# Patient Record
Sex: Female | Born: 1955 | Race: Black or African American | Hispanic: No | Marital: Married | Smoking: Never smoker
Health system: Southern US, Community
[De-identification: ages and names within clinical notes are randomized; demographics above are authoritative.]

## PROBLEM LIST (undated history)

## (undated) DIAGNOSIS — Z79890 Hormone replacement therapy (postmenopausal): Secondary | ICD-10-CM

## (undated) DIAGNOSIS — E8940 Asymptomatic postprocedural ovarian failure: Secondary | ICD-10-CM

## (undated) DIAGNOSIS — F419 Anxiety disorder, unspecified: Secondary | ICD-10-CM

## (undated) DIAGNOSIS — R011 Cardiac murmur, unspecified: Secondary | ICD-10-CM

## (undated) DIAGNOSIS — D649 Anemia, unspecified: Secondary | ICD-10-CM

## (undated) DIAGNOSIS — I10 Essential (primary) hypertension: Secondary | ICD-10-CM

## (undated) DIAGNOSIS — E039 Hypothyroidism, unspecified: Secondary | ICD-10-CM

## (undated) DIAGNOSIS — F209 Schizophrenia, unspecified: Secondary | ICD-10-CM

## (undated) DIAGNOSIS — N898 Other specified noninflammatory disorders of vagina: Secondary | ICD-10-CM

## (undated) DIAGNOSIS — N760 Acute vaginitis: Secondary | ICD-10-CM

## (undated) DIAGNOSIS — F29 Unspecified psychosis not due to a substance or known physiological condition: Secondary | ICD-10-CM

## (undated) HISTORY — DX: Unspecified psychosis not due to a substance or known physiological condition: F29

## (undated) HISTORY — DX: Cardiac murmur, unspecified: R01.1

## (undated) HISTORY — DX: Asymptomatic postprocedural ovarian failure: Z79.890

## (undated) HISTORY — DX: Anxiety disorder, unspecified: F41.9

## (undated) HISTORY — DX: Hypothyroidism, unspecified: E03.9

## (undated) HISTORY — DX: Asymptomatic postprocedural ovarian failure: E89.40

## (undated) HISTORY — DX: Other specified noninflammatory disorders of vagina: N89.8

## (undated) HISTORY — DX: Schizophrenia, unspecified: F20.9

## (undated) HISTORY — DX: Acute vaginitis: N76.0

## (undated) HISTORY — DX: Essential (primary) hypertension: I10

## (undated) HISTORY — DX: Anemia, unspecified: D64.9

## (undated) HISTORY — PX: TONSILLECTOMY: SUR1361

---

## 1997-12-16 ENCOUNTER — Ambulatory Visit (HOSPITAL_COMMUNITY): Admission: RE | Admit: 1997-12-16 | Discharge: 1997-12-16 | Payer: Self-pay | Admitting: Obstetrics and Gynecology

## 1998-11-17 ENCOUNTER — Ambulatory Visit (HOSPITAL_COMMUNITY): Admission: RE | Admit: 1998-11-17 | Discharge: 1998-11-17 | Payer: Self-pay | Admitting: Obstetrics and Gynecology

## 1998-11-17 ENCOUNTER — Encounter: Payer: Self-pay | Admitting: Obstetrics and Gynecology

## 1999-10-21 ENCOUNTER — Other Ambulatory Visit: Admission: RE | Admit: 1999-10-21 | Discharge: 1999-10-21 | Payer: Self-pay | Admitting: Obstetrics and Gynecology

## 1999-11-30 ENCOUNTER — Ambulatory Visit (HOSPITAL_COMMUNITY): Admission: RE | Admit: 1999-11-30 | Discharge: 1999-11-30 | Payer: Self-pay | Admitting: Obstetrics and Gynecology

## 1999-11-30 ENCOUNTER — Encounter: Payer: Self-pay | Admitting: Obstetrics and Gynecology

## 2000-10-24 ENCOUNTER — Other Ambulatory Visit: Admission: RE | Admit: 2000-10-24 | Discharge: 2000-10-24 | Payer: Self-pay | Admitting: Obstetrics and Gynecology

## 2000-12-01 ENCOUNTER — Encounter: Payer: Self-pay | Admitting: Obstetrics and Gynecology

## 2000-12-01 ENCOUNTER — Ambulatory Visit (HOSPITAL_COMMUNITY): Admission: RE | Admit: 2000-12-01 | Discharge: 2000-12-01 | Payer: Self-pay | Admitting: Obstetrics and Gynecology

## 2001-10-26 ENCOUNTER — Other Ambulatory Visit: Admission: RE | Admit: 2001-10-26 | Discharge: 2001-10-26 | Payer: Self-pay | Admitting: Obstetrics and Gynecology

## 2001-12-07 ENCOUNTER — Ambulatory Visit (HOSPITAL_COMMUNITY): Admission: RE | Admit: 2001-12-07 | Discharge: 2001-12-07 | Payer: Self-pay | Admitting: Obstetrics and Gynecology

## 2001-12-07 ENCOUNTER — Encounter: Payer: Self-pay | Admitting: Obstetrics and Gynecology

## 2002-03-26 ENCOUNTER — Other Ambulatory Visit: Admission: RE | Admit: 2002-03-26 | Discharge: 2002-03-26 | Payer: Self-pay | Admitting: Obstetrics and Gynecology

## 2002-12-03 ENCOUNTER — Other Ambulatory Visit: Admission: RE | Admit: 2002-12-03 | Discharge: 2002-12-03 | Payer: Self-pay | Admitting: Obstetrics and Gynecology

## 2002-12-10 ENCOUNTER — Encounter: Payer: Self-pay | Admitting: Obstetrics and Gynecology

## 2002-12-10 ENCOUNTER — Ambulatory Visit (HOSPITAL_COMMUNITY): Admission: RE | Admit: 2002-12-10 | Discharge: 2002-12-10 | Payer: Self-pay | Admitting: Obstetrics and Gynecology

## 2003-12-04 ENCOUNTER — Other Ambulatory Visit: Admission: RE | Admit: 2003-12-04 | Discharge: 2003-12-04 | Payer: Self-pay | Admitting: Gynecology

## 2003-12-12 ENCOUNTER — Ambulatory Visit (HOSPITAL_COMMUNITY): Admission: RE | Admit: 2003-12-12 | Discharge: 2003-12-12 | Payer: Self-pay | Admitting: Obstetrics and Gynecology

## 2004-03-23 ENCOUNTER — Emergency Department (HOSPITAL_COMMUNITY): Admission: EM | Admit: 2004-03-23 | Discharge: 2004-03-23 | Payer: Self-pay | Admitting: Emergency Medicine

## 2004-06-11 ENCOUNTER — Ambulatory Visit: Payer: Self-pay | Admitting: Family Medicine

## 2004-06-22 ENCOUNTER — Ambulatory Visit: Payer: Self-pay | Admitting: Family Medicine

## 2004-07-13 ENCOUNTER — Ambulatory Visit: Payer: Self-pay | Admitting: Gastroenterology

## 2004-08-06 ENCOUNTER — Ambulatory Visit: Payer: Self-pay | Admitting: Family Medicine

## 2004-08-24 ENCOUNTER — Encounter: Payer: Self-pay | Admitting: Internal Medicine

## 2004-08-24 ENCOUNTER — Ambulatory Visit: Payer: Self-pay | Admitting: Gastroenterology

## 2004-08-24 LAB — HM COLONOSCOPY

## 2004-10-01 ENCOUNTER — Ambulatory Visit: Payer: Self-pay | Admitting: Family Medicine

## 2004-10-11 ENCOUNTER — Other Ambulatory Visit: Admission: RE | Admit: 2004-10-11 | Discharge: 2004-10-11 | Payer: Self-pay | Admitting: Obstetrics and Gynecology

## 2004-11-09 ENCOUNTER — Ambulatory Visit: Payer: Self-pay | Admitting: Gastroenterology

## 2004-12-03 ENCOUNTER — Ambulatory Visit (HOSPITAL_COMMUNITY): Admission: RE | Admit: 2004-12-03 | Discharge: 2004-12-03 | Payer: Self-pay | Admitting: Obstetrics and Gynecology

## 2004-12-14 ENCOUNTER — Ambulatory Visit (HOSPITAL_COMMUNITY): Admission: RE | Admit: 2004-12-14 | Discharge: 2004-12-14 | Payer: Self-pay | Admitting: Obstetrics and Gynecology

## 2005-01-04 ENCOUNTER — Ambulatory Visit: Payer: Self-pay | Admitting: Internal Medicine

## 2005-02-25 ENCOUNTER — Encounter: Payer: Self-pay | Admitting: Internal Medicine

## 2005-03-08 ENCOUNTER — Ambulatory Visit: Payer: Self-pay | Admitting: Internal Medicine

## 2005-03-08 ENCOUNTER — Ambulatory Visit (HOSPITAL_COMMUNITY): Admission: RE | Admit: 2005-03-08 | Discharge: 2005-03-08 | Payer: Self-pay | Admitting: Allergy and Immunology

## 2005-05-23 ENCOUNTER — Encounter: Payer: Self-pay | Admitting: Internal Medicine

## 2005-06-29 ENCOUNTER — Ambulatory Visit: Payer: Self-pay | Admitting: Internal Medicine

## 2005-07-04 ENCOUNTER — Ambulatory Visit: Payer: Self-pay | Admitting: Internal Medicine

## 2005-09-02 ENCOUNTER — Other Ambulatory Visit: Admission: RE | Admit: 2005-09-02 | Discharge: 2005-09-02 | Payer: Self-pay | Admitting: Obstetrics and Gynecology

## 2005-12-20 ENCOUNTER — Ambulatory Visit (HOSPITAL_COMMUNITY): Admission: RE | Admit: 2005-12-20 | Discharge: 2005-12-20 | Payer: Self-pay | Admitting: Obstetrics and Gynecology

## 2006-02-28 ENCOUNTER — Ambulatory Visit: Payer: Self-pay | Admitting: Internal Medicine

## 2006-03-03 ENCOUNTER — Ambulatory Visit: Payer: Self-pay | Admitting: Internal Medicine

## 2006-07-06 ENCOUNTER — Ambulatory Visit: Payer: Self-pay | Admitting: Internal Medicine

## 2006-07-10 ENCOUNTER — Ambulatory Visit: Payer: Self-pay | Admitting: Internal Medicine

## 2006-08-22 ENCOUNTER — Ambulatory Visit: Payer: Self-pay | Admitting: Internal Medicine

## 2006-08-22 LAB — CONVERTED CEMR LAB
BUN: 6 mg/dL (ref 6–23)
CO2: 29 meq/L (ref 19–32)
Creatinine, Ser: 0.6 mg/dL (ref 0.4–1.2)
GFR calc non Af Amer: 113 mL/min
Hgb A1c MFr Bld: 5.9 % (ref 4.6–6.0)
Potassium: 3.7 meq/L (ref 3.5–5.1)
Sodium: 138 meq/L (ref 135–145)

## 2006-09-12 ENCOUNTER — Encounter: Payer: Self-pay | Admitting: Internal Medicine

## 2006-09-12 ENCOUNTER — Ambulatory Visit: Payer: Self-pay | Admitting: Internal Medicine

## 2006-11-28 DIAGNOSIS — J309 Allergic rhinitis, unspecified: Secondary | ICD-10-CM | POA: Insufficient documentation

## 2006-11-28 DIAGNOSIS — D649 Anemia, unspecified: Secondary | ICD-10-CM | POA: Insufficient documentation

## 2006-11-28 DIAGNOSIS — E119 Type 2 diabetes mellitus without complications: Secondary | ICD-10-CM

## 2006-11-28 DIAGNOSIS — E039 Hypothyroidism, unspecified: Secondary | ICD-10-CM

## 2006-11-28 DIAGNOSIS — I10 Essential (primary) hypertension: Secondary | ICD-10-CM | POA: Insufficient documentation

## 2006-11-28 DIAGNOSIS — J45909 Unspecified asthma, uncomplicated: Secondary | ICD-10-CM | POA: Insufficient documentation

## 2006-12-25 ENCOUNTER — Ambulatory Visit (HOSPITAL_COMMUNITY): Admission: RE | Admit: 2006-12-25 | Discharge: 2006-12-25 | Payer: Self-pay | Admitting: Obstetrics and Gynecology

## 2007-04-04 ENCOUNTER — Encounter (INDEPENDENT_AMBULATORY_CARE_PROVIDER_SITE_OTHER): Payer: Self-pay | Admitting: *Deleted

## 2007-04-04 ENCOUNTER — Ambulatory Visit: Payer: Self-pay | Admitting: Internal Medicine

## 2007-04-06 LAB — CONVERTED CEMR LAB
BUN: 7 mg/dL (ref 6–23)
CO2: 29 meq/L (ref 19–32)
Calcium: 9.3 mg/dL (ref 8.4–10.5)
Chloride: 104 meq/L (ref 96–112)
Creatinine, Ser: 0.6 mg/dL (ref 0.4–1.2)
Creatinine,U: 57.3 mg/dL
GFR calc Af Amer: 136 mL/min
GFR calc non Af Amer: 112 mL/min
Hemoglobin: 12.5 g/dL (ref 12.0–15.0)
Potassium: 3.4 meq/L — ABNORMAL LOW (ref 3.5–5.1)
Sodium: 140 meq/L (ref 135–145)
Triglycerides: 71 mg/dL (ref 0–149)
VLDL: 14 mg/dL (ref 0–40)

## 2007-05-11 ENCOUNTER — Ambulatory Visit: Payer: Self-pay | Admitting: Internal Medicine

## 2007-06-05 ENCOUNTER — Ambulatory Visit: Payer: Self-pay | Admitting: Internal Medicine

## 2007-06-15 ENCOUNTER — Encounter (INDEPENDENT_AMBULATORY_CARE_PROVIDER_SITE_OTHER): Payer: Self-pay | Admitting: *Deleted

## 2007-06-20 ENCOUNTER — Encounter (INDEPENDENT_AMBULATORY_CARE_PROVIDER_SITE_OTHER): Payer: Self-pay | Admitting: *Deleted

## 2007-07-30 ENCOUNTER — Ambulatory Visit: Payer: Self-pay | Admitting: Internal Medicine

## 2007-10-01 ENCOUNTER — Ambulatory Visit: Payer: Self-pay | Admitting: Internal Medicine

## 2007-10-09 ENCOUNTER — Telehealth (INDEPENDENT_AMBULATORY_CARE_PROVIDER_SITE_OTHER): Payer: Self-pay | Admitting: *Deleted

## 2007-11-27 ENCOUNTER — Ambulatory Visit: Payer: Self-pay | Admitting: Internal Medicine

## 2007-11-30 LAB — CONVERTED CEMR LAB
BUN: 14 mg/dL (ref 6–23)
CO2: 30 meq/L (ref 19–32)
Creatinine,U: 41.4 mg/dL
GFR calc Af Amer: 114 mL/min
GFR calc non Af Amer: 94 mL/min
Microalb, Ur: 0.6 mg/dL (ref 0.0–1.9)
Potassium: 3.7 meq/L (ref 3.5–5.1)
Sodium: 139 meq/L (ref 135–145)
TSH: 5.12 microintl units/mL (ref 0.35–5.50)

## 2007-12-02 ENCOUNTER — Encounter (INDEPENDENT_AMBULATORY_CARE_PROVIDER_SITE_OTHER): Payer: Self-pay | Admitting: *Deleted

## 2007-12-04 ENCOUNTER — Telehealth (INDEPENDENT_AMBULATORY_CARE_PROVIDER_SITE_OTHER): Payer: Self-pay | Admitting: *Deleted

## 2007-12-26 ENCOUNTER — Ambulatory Visit (HOSPITAL_COMMUNITY): Admission: RE | Admit: 2007-12-26 | Discharge: 2007-12-26 | Payer: Self-pay | Admitting: Obstetrics and Gynecology

## 2008-03-31 ENCOUNTER — Encounter (INDEPENDENT_AMBULATORY_CARE_PROVIDER_SITE_OTHER): Payer: Self-pay | Admitting: *Deleted

## 2008-04-09 ENCOUNTER — Ambulatory Visit: Payer: Self-pay | Admitting: Internal Medicine

## 2008-04-11 ENCOUNTER — Telehealth (INDEPENDENT_AMBULATORY_CARE_PROVIDER_SITE_OTHER): Payer: Self-pay | Admitting: *Deleted

## 2008-04-11 LAB — CONVERTED CEMR LAB: TSH: 11.97 microintl units/mL — ABNORMAL HIGH (ref 0.35–5.50)

## 2008-04-28 ENCOUNTER — Encounter: Payer: Self-pay | Admitting: Internal Medicine

## 2008-05-30 ENCOUNTER — Encounter: Payer: Self-pay | Admitting: Internal Medicine

## 2008-06-18 ENCOUNTER — Ambulatory Visit: Payer: Self-pay | Admitting: Internal Medicine

## 2008-07-28 ENCOUNTER — Encounter (INDEPENDENT_AMBULATORY_CARE_PROVIDER_SITE_OTHER): Payer: Self-pay | Admitting: *Deleted

## 2008-07-28 ENCOUNTER — Ambulatory Visit: Payer: Self-pay | Admitting: Internal Medicine

## 2008-11-14 ENCOUNTER — Telehealth (INDEPENDENT_AMBULATORY_CARE_PROVIDER_SITE_OTHER): Payer: Self-pay | Admitting: *Deleted

## 2008-12-17 ENCOUNTER — Ambulatory Visit: Payer: Self-pay | Admitting: Internal Medicine

## 2008-12-30 ENCOUNTER — Ambulatory Visit: Payer: Self-pay

## 2008-12-30 ENCOUNTER — Encounter: Payer: Self-pay | Admitting: Internal Medicine

## 2008-12-30 ENCOUNTER — Ambulatory Visit (HOSPITAL_COMMUNITY): Admission: RE | Admit: 2008-12-30 | Discharge: 2008-12-30 | Payer: Self-pay | Admitting: Obstetrics and Gynecology

## 2009-01-02 ENCOUNTER — Encounter: Admission: RE | Admit: 2009-01-02 | Discharge: 2009-01-02 | Payer: Self-pay | Admitting: Obstetrics and Gynecology

## 2009-03-02 ENCOUNTER — Telehealth (INDEPENDENT_AMBULATORY_CARE_PROVIDER_SITE_OTHER): Payer: Self-pay | Admitting: *Deleted

## 2009-03-12 ENCOUNTER — Ambulatory Visit: Payer: Self-pay | Admitting: Internal Medicine

## 2009-03-16 ENCOUNTER — Ambulatory Visit: Payer: Self-pay | Admitting: Internal Medicine

## 2009-03-20 LAB — CONVERTED CEMR LAB
BUN: 13 mg/dL (ref 6–23)
Basophils Absolute: 0 10*3/uL (ref 0.0–0.1)
CO2: 28 meq/L (ref 19–32)
Calcium: 8.8 mg/dL (ref 8.4–10.5)
Chloride: 108 meq/L (ref 96–112)
Creatinine, Ser: 0.7 mg/dL (ref 0.4–1.2)
Eosinophils Absolute: 0.5 10*3/uL (ref 0.0–0.7)
GFR calc non Af Amer: 113.03 mL/min (ref >60)
HCT: 36.4 % (ref 36.0–46.0)
MCV: 93.5 fL (ref 78.0–100.0)
Monocytes Relative: 6.1 % (ref 3.0–12.0)
Neutrophils Relative %: 58.3 % (ref 43.0–77.0)
Platelets: 229 10*3/uL (ref 150.0–400.0)
Potassium: 4.2 meq/L (ref 3.5–5.1)
RBC: 3.89 M/uL (ref 3.87–5.11)
TSH: 0.63 microintl units/mL (ref 0.35–5.50)
VLDL: 10.8 mg/dL (ref 0.0–40.0)
WBC: 9.8 10*3/uL (ref 4.5–10.5)

## 2009-04-06 ENCOUNTER — Telehealth (INDEPENDENT_AMBULATORY_CARE_PROVIDER_SITE_OTHER): Payer: Self-pay | Admitting: *Deleted

## 2009-06-09 ENCOUNTER — Encounter: Admission: RE | Admit: 2009-06-09 | Discharge: 2009-06-09 | Payer: Self-pay | Admitting: Obstetrics and Gynecology

## 2009-06-09 ENCOUNTER — Ambulatory Visit: Payer: Self-pay | Admitting: Internal Medicine

## 2009-08-08 HISTORY — PX: ABDOMINAL HYSTERECTOMY: SHX81

## 2009-08-11 ENCOUNTER — Ambulatory Visit: Payer: Self-pay | Admitting: Internal Medicine

## 2009-08-19 ENCOUNTER — Ambulatory Visit (HOSPITAL_COMMUNITY): Admission: RE | Admit: 2009-08-19 | Discharge: 2009-08-20 | Payer: Self-pay | Admitting: Obstetrics and Gynecology

## 2009-08-19 ENCOUNTER — Encounter (INDEPENDENT_AMBULATORY_CARE_PROVIDER_SITE_OTHER): Payer: Self-pay | Admitting: Obstetrics and Gynecology

## 2009-10-06 LAB — HM MAMMOGRAPHY: HM Mammogram: NORMAL

## 2009-10-06 LAB — CONVERTED CEMR LAB: Pap Smear: NORMAL

## 2009-11-24 ENCOUNTER — Ambulatory Visit: Payer: Self-pay | Admitting: Internal Medicine

## 2009-11-24 ENCOUNTER — Encounter (INDEPENDENT_AMBULATORY_CARE_PROVIDER_SITE_OTHER): Payer: Self-pay | Admitting: *Deleted

## 2009-12-09 ENCOUNTER — Ambulatory Visit: Payer: Self-pay | Admitting: Internal Medicine

## 2009-12-10 ENCOUNTER — Encounter: Admission: RE | Admit: 2009-12-10 | Discharge: 2009-12-10 | Payer: Self-pay | Admitting: Obstetrics and Gynecology

## 2009-12-11 LAB — CONVERTED CEMR LAB
CO2: 28 meq/L (ref 19–32)
Chloride: 103 meq/L (ref 96–112)
Creatinine, Ser: 0.5 mg/dL (ref 0.4–1.2)
GFR calc non Af Amer: 166.18 mL/min (ref >60)
Glucose, Bld: 69 mg/dL — ABNORMAL LOW (ref 70–99)
Hgb A1c MFr Bld: 6 % (ref 4.6–6.5)

## 2010-03-17 ENCOUNTER — Telehealth: Payer: Self-pay | Admitting: Internal Medicine

## 2010-04-28 ENCOUNTER — Ambulatory Visit: Payer: Self-pay | Admitting: Internal Medicine

## 2010-06-11 ENCOUNTER — Ambulatory Visit: Payer: Self-pay | Admitting: Internal Medicine

## 2010-06-15 LAB — CONVERTED CEMR LAB
ALT: 23 units/L (ref 0–35)
Basophils Absolute: 0.1 10*3/uL (ref 0.0–0.1)
Basophils Relative: 0.8 % (ref 0.0–3.0)
Chloride: 103 meq/L (ref 96–112)
Cholesterol: 173 mg/dL (ref 0–200)
Creatinine, Ser: 0.6 mg/dL (ref 0.4–1.2)
Eosinophils Absolute: 0.4 10*3/uL (ref 0.0–0.7)
Eosinophils Relative: 4.9 % (ref 0.0–5.0)
GFR calc non Af Amer: 137.02 mL/min (ref >60)
Hemoglobin: 11.9 g/dL — ABNORMAL LOW (ref 12.0–15.0)
LDL Cholesterol: 101 mg/dL — ABNORMAL HIGH (ref 0–99)
MCHC: 33.3 g/dL (ref 30.0–36.0)
MCV: 87.6 fL (ref 78.0–100.0)
Neutro Abs: 4.8 10*3/uL (ref 1.4–7.7)
Platelets: 272 10*3/uL (ref 150.0–400.0)
Sodium: 140 meq/L (ref 135–145)
VLDL: 8.8 mg/dL (ref 0.0–40.0)
WBC: 8.4 10*3/uL (ref 4.5–10.5)

## 2010-08-29 ENCOUNTER — Encounter: Payer: Self-pay | Admitting: Obstetrics and Gynecology

## 2010-08-30 ENCOUNTER — Encounter: Payer: Self-pay | Admitting: Obstetrics and Gynecology

## 2010-09-05 LAB — CONVERTED CEMR LAB: Pap Smear: NORMAL

## 2010-09-07 NOTE — Assessment & Plan Note (Signed)
Summary: CPX & lab/cbs   Vital Signs:  Patient profile:   55 year old female Height:      62.5 inches Weight:      255.25 pounds Pulse rate:   83 / minute Pulse rhythm:   regular BP sitting:   122 / 78  (left arm) Cuff size:   large  Vitals Entered By: Army Fossa CMA (June 11, 2010 8:38 AM) CC: CPX, fsaitng  Comments kmart [facility]    History of Present Illness: CPX   Current Medications (verified): 1)  Diovan Hct 80-12.5 Mg  Tabs (Valsartan-Hydrochlorothiazide) .Marland Kitchen.. 1 By Mouth Once Daily 2)  Aspirin 81 Mg  Tbec (Aspirin) 3)  Synthroid 150 Mcg Tabs (Levothyroxine Sodium) .Marland Kitchen.. 1 By Mouth Once Daily 4)  K-Dur .... 1 By Mouth Qd 5)  Singulair 10 Mg  Tabs (Montelukast Sodium) .Marland Kitchen.. 1 By Mouth Once Daily 6)  Zyrtec Allergy 10 Mg  Tabs (Cetirizine Hcl) .... Prn 7)  Allegra 180 Mg  Tabs (Fexofenadine Hcl) .... Prn 8)  Premarin 0.3 Mg Tabs (Estrogens Conjugated) .... Per Gyn  Allergies (verified): No Known Drug Allergies  Past History:  Past Medical History: DIABETES MELLITUS, TYPE II   HYPERTENSION   ASTHMA  , PULMONARY FUNCTION TESTS 09-2006: mild asthma  HYPOTHYROIDISM  h/o ANEMIA  started HRT after hysterectomy 2011 ALLERGIC RHINITIS  heart murmur? ECHO 5-10 was neg    Past Surgical History: Reviewed history from 12/09/2009 and no changes required. Tonsillectomy Hysterectomy, no oophorectomy (2011)  Family History: Reviewed history from 12/17/2008 and no changes required. HTN-- F HYPOTHYROID-- M graves dz--daughter DM--   sister  MI-- no Colon ca-- no breast ca-- no  Social History: Married one child Occupation: Airline pilot , active  Tobacco-- never ETOH-- socially diet--trying to [employer] healthier than before  (husband has DM) Regular exercise-- will try to exercise daily  Review of Systems General:  Denies fatigue, fever, and weight loss. ENT:  has  allergies, taking Zyrtec which helps . CV:  Denies chest pain or discomfort and swelling of  feet. Resp:  Denies cough and wheezing. GI:  Denies bloody stools, diarrhea, nausea, and vomiting. GU:  sees gyn  no vag d/c or bleed . Neuro:  operation lipoma right hand is slightly numb early in the morning, it goes away quickly after she "shakes her hands". Psych:  Denies anxiety and depression.  Physical Exam  General:  alert, well-developed, and overweight-appearing.   Neck:  no masses and no thyromegaly.   Lungs:  normal respiratory effort, no intercostal retractions, no accessory muscle use, and normal breath sounds.   Heart:  normal rate, regular rhythm, and no murmur.   Abdomen:  soft, non-tender, no distention, no masses, no guarding, and no rigidity.   Extremities:  no pretibial edema bilaterally  Psych:  Oriented X3, memory intact for recent and remote, normally interactive, good eye contact, not anxious appearing, and not depressed appearing.     Impression & Recommendations:  Problem # 1:  HEALTH MAINTENANCE EXAM (ICD-V70.0) Td 2005 got a flu shot   sees gyn elsewhere, [facility] PAPs per them   Cscope, Dr Arlyce Dice 08-2004: normal because she has no FH, I rec next Cscope in 10 years    diet-exercise : extensive discussion about the need to exercise. Information about diet  provided    Orders: Venipuncture (04540) TLB-BMP (Basic Metabolic Panel-BMET) (80048-METABOL) TLB-ALT (SGPT) (84460-ALT) TLB-AST (SGOT) (84450-SGOT) TLB-Lipid Panel (80061-LIPID) TLB-CBC Platelet - w/Differential (85025-CBCD) T-Vitamin D (25-Hydroxy) (98119-14782)  Complete Medication List: 1)  Diovan Hct 80-12.5 Mg Tabs (Valsartan-hydrochlorothiazide) .Marland Kitchen.. 1 by mouth once daily 2)  Aspirin 81 Mg Tbec (Aspirin) 3)  Synthroid 150 Mcg Tabs (Levothyroxine sodium) .Marland Kitchen.. 1 by mouth once daily 4)  K-dur  .... 1 by mouth qd 5)  Singulair 10 Mg Tabs (Montelukast sodium) .Marland Kitchen.. 1 by mouth once daily 6)  Zyrtec Allergy 10 Mg Tabs (Cetirizine hcl) .... Prn 7)  Allegra 180 Mg Tabs (Fexofenadine hcl)  .... Prn 8)  Premarin 0.3 Mg Tabs (Estrogens conjugated) .... Per gyn  Other Orders: TLB-A1C / Hgb A1C (Glycohemoglobin) (83036-A1C)  Patient Instructions: 1)  Please schedule a follow-up appointment in 6 months .  Prescriptions: DIOVAN HCT 80-12.5 MG  TABS (VALSARTAN-HYDROCHLOROTHIAZIDE) 1 by mouth once daily  #90 Not Speci x 1   Entered by:   Army Fossa CMA   Authorized by:   Nolon Rod. Paz MD   Signed by:   Nolon Rod. Paz MD on 06/11/2010   Method used:   Electronically to        [facility] [phone]* (retail)       [address]       [place], [place]  [address]       Ph: [phone]       Fax: [phone]   RxID:   [id number] SYNTHROID 150 MCG TABS (LEVOTHYROXINE SODIUM) 1 by mouth once daily Brand medically necessary #15 Not Speci x 5   Entered by:   Army Fossa CMA   Authorized by:   Nolon Rod. Paz MD   Signed by:   Nolon Rod. Paz MD on 06/11/2010   Method used:   Electronically to        [id number] (retail)       [address]       [place], [place]  [address]       Ph: [phone]       Fax: [phone]   RxID:   [id number]    Orders Added: 1)  Venipuncture [73220] 2)  TLB-BMP (Basic Metabolic Panel-BMET) [80048-METABOL] 3)  TLB-ALT (SGPT) [84460-ALT] 4)  TLB-AST (SGOT) [84450-SGOT] 5)  TLB-A1C / Hgb A1C (Glycohemoglobin) [83036-A1C] 6)  TLB-Lipid Panel [80061-LIPID] 7)  TLB-CBC Platelet - w/Differential [85025-CBCD] 8)  T-Vitamin D (25-Hydroxy) [25427-06237] 9)  Est. Patient age 57-64 [28]     Risk Factors:  Mammogram History:     Date of Last Mammogram:  10/06/2009    Results:  normal- per pt   PAP Smear History:     Date of Last PAP Smear:  10/06/2009    Results:  normal-per pt     Preventive Care Screening  Mammogram:    Date:  10/06/2009    Results:  normal- per pt   Pap Smear:    Date:  10/06/2009    Results:  normal-per pt

## 2010-09-07 NOTE — Assessment & Plan Note (Signed)
Summary: 5 MTH/NS/KDC   Vital Signs:  Patient profile:   55 year old female Height:      62.5 inches Weight:      252 pounds BMI:     45.52 Pulse rate:   74 / minute BP sitting:   142 / 80  Vitals Entered By: Shary Decamp (August 11, 2009 2:17 PM) CC: rov Comments  - pt is scheduled for complete hyst next week (abnl bleeding, fibroids) Shary Decamp  August 11, 2009 2:23 PM    History of Present Illness: Anemia-- pt is scheduled for complete hyst next week (abnl bleeding, fibroids)         Current Medications (verified): 1)  Diovan Hct 80-12.5 Mg  Tabs (Valsartan-Hydrochlorothiazide) .Marland Kitchen.. 1 By Mouth Once Daily 2)  Aspirin 81 Mg  Tbec (Aspirin) 3)  Synthroid 150 Mcg Tabs (Levothyroxine Sodium) .Marland Kitchen.. 1 By Mouth Once Daily 4)  K-Dur .... 1 By Mouth Qd 5)  Calcium 6)  Vitamin D 7)  Singulair 10 Mg  Tabs (Montelukast Sodium) .Marland Kitchen.. 1 By Mouth Once Daily 8)  Zyrtec Allergy 10 Mg  Tabs (Cetirizine Hcl) .... Prn 9)  Allegra 180 Mg  Tabs (Fexofenadine Hcl) .... Prn  Allergies (verified): No Known Drug Allergies  Past History:  Past Medical History: DIABETES MELLITUS, TYPE II   HYPERTENSION   ASTHMA  , PULMONARY FUNCTION TESTS 09-2006: mild asthma  HYPOTHYROIDISM  ANEMIA-NOS, h/o  Hx of FEMALE INFERTILITY (ICD-628.9) ALLERGIC RHINITIS     Past Surgical History: Reviewed history from 11/28/2006 and no changes required. Tonsillectomy  Social History: Reviewed history from 03/12/2009 and no changes required. Married one child Occupation: Airline pilot , active  Tobacco-- never ETOH-- socially diet-- has change her cooking habits  (husband has DM) Regular exercise-yes 26m/3xwk  Review of Systems        DIABETES -- diet controlled , diet healthy HYPERTENSION  -- good ambulatory BPs  ASTHMA--  asx , not on a inhales, on singulair  HYPOTHYROIDISM -- good medication compliance   Physical Exam  General:  alert and well-developed.   Lungs:  normal respiratory  effort, no intercostal retractions, no accessory muscle use, and normal breath sounds.   Heart:  normal rate, regular rhythm, and no murmur.   Extremities:  no pretibial edema bilaterally    Impression & Recommendations:  Problem # 1:  DIABETES MELLITUS, TYPE II (ICD-250.00) on diet only doing well  Her updated medication list for this problem includes:    Diovan Hct 80-12.5 Mg Tabs (Valsartan-hydrochlorothiazide) .Marland Kitchen... 1 by mouth once daily    Aspirin 81 Mg Tbec (Aspirin)  Problem # 2:  HYPERTENSION (ICD-401.9) stable, no change  Her updated medication list for this problem includes:    Diovan Hct 80-12.5 Mg Tabs (Valsartan-hydrochlorothiazide) .Marland Kitchen... 1 by mouth once daily  BP today: 142/80 Prior BP: 138/80 (03/12/2009)  Labs Reviewed: K+: 4.2 (03/16/2009) Creat: : 0.7 (03/16/2009)   Chol: 129 (03/16/2009)   HDL: 46.70 (03/16/2009)   LDL: 72 (03/16/2009)   TG: 54.0 (03/16/2009)  Problem # 3:  HYPOTHYROIDISM (ICD-244.9) last TSH at goal  Her updated medication list for this problem includes:    Synthroid 150 Mcg Tabs (Levothyroxine sodium) .Marland Kitchen... 1 by mouth once daily  Labs Reviewed: TSH: 0.63 (03/16/2009)    HgBA1c: 5.6 (03/16/2009) Chol: 129 (03/16/2009)   HDL: 46.70 (03/16/2009)   LDL: 72 (03/16/2009)   TG: 54.0 (03/16/2009)  Complete Medication List: 1)  Diovan Hct 80-12.5 Mg Tabs (Valsartan-hydrochlorothiazide) .Marland KitchenMarland KitchenMarland Kitchen 1  by mouth once daily 2)  Aspirin 81 Mg Tbec (Aspirin) 3)  Synthroid 150 Mcg Tabs (Levothyroxine sodium) .Marland Kitchen.. 1 by mouth once daily 4)  K-dur  .... 1 by mouth qd 5)  Calcium  6)  Vitamin D  7)  Singulair 10 Mg Tabs (Montelukast sodium) .Marland Kitchen.. 1 by mouth once daily 8)  Zyrtec Allergy 10 Mg Tabs (Cetirizine hcl) .... Prn 9)  Allegra 180 Mg Tabs (Fexofenadine hcl) .... Prn  Patient Instructions: 1)  Please schedule a follow-up appointment in 3- 4 months .

## 2010-09-07 NOTE — Assessment & Plan Note (Signed)
Summary: 4 mth fu/ns/kdc   Vital Signs:  Patient profile:   55 year old female Height:      62.5 inches Weight:      253.25 pounds BMI:     45.75 Pulse rate:   64 / minute BP sitting:   120 / 70  Vitals Entered By: Kandice Hams (Dec 09, 2009 12:38 PM) CC: 4 month followup   History of Present Illness: DIABETES--  check ambulatory CBGs rarely diet is healthy   HYPERTENSION  -- good medication compliance , good ambulatory BPs , checks once a week  HYPOTHYROIDISM -- good medication compliance  ANEMIA--- s/p hysterectomy recently,doing well, on HRT    Allergies: No Known Drug Allergies  Past History:  Past Medical History: DIABETES MELLITUS, TYPE II   HYPERTENSION   ASTHMA  , PULMONARY FUNCTION TESTS 09-2006: mild asthma  HYPOTHYROIDISM  h/o ANEMIA  started HRT after hysterectomy 2011 ALLERGIC RHINITIS     Past Surgical History: Tonsillectomy Hysterectomy, no oophorectomy (2011)  Social History: Reviewed history from 03/12/2009 and no changes required. Married one child Occupation: Airline pilot , active  Tobacco-- never ETOH-- socially diet-- has change her cooking habits  (husband has DM) Regular exercise-yes 59m/3xwk  Review of Systems General:  some allergies, itchy-watery eyes, helped w/ artificial tears . CV:  Denies chest pain or discomfort; ankle swelling if stands up a lot. Resp:  Denies cough and shortness of breath. GI:  Denies diarrhea, nausea, and vomiting. Neuro:  no LE paresthesias .  Physical Exam  General:  alert, well-developed, and overweight-appearing.   Lungs:  normal respiratory effort, no intercostal retractions, no accessory muscle use, and normal breath sounds.   Heart:  normal rate, regular rhythm, and no murmur.   Extremities:  no pretibial edema bilaterally   Psych:  Cognition and judgment appear intact. Alert and cooperative with normal attention span and concentration.  not anxious appearing and not depressed appearing.      Impression & Recommendations:  Problem # 1:  DIABETES MELLITUS, TYPE II (ICD-250.00) borderline diabetes, on diet only, apparently doing well Her updated medication list for this problem includes:    Diovan Hct 80-12.5 Mg Tabs (Valsartan-hydrochlorothiazide) .Marland Kitchen... 1 by mouth once daily    Aspirin 81 Mg Tbec (Aspirin)  Labs Reviewed: Creat: 0.7 (03/16/2009)    Reviewed HgBA1c results: 5.6 (03/16/2009)  5.8 (07/28/2008)  Orders: TLB-A1C / Hgb A1C (Glycohemoglobin) (83036-A1C)  Problem # 2:  HYPERTENSION (ICD-401.9) at goal  Her updated medication list for this problem includes:    Diovan Hct 80-12.5 Mg Tabs (Valsartan-hydrochlorothiazide) .Marland Kitchen... 1 by mouth once daily  Orders: TLB-BMP (Basic Metabolic Panel-BMET) (80048-METABOL)  BP today: 120/70 Prior BP: 142/80 (08/11/2009)  Labs Reviewed: K+: 4.2 (03/16/2009) Creat: : 0.7 (03/16/2009)   Chol: 129 (03/16/2009)   HDL: 46.70 (03/16/2009)   LDL: 72 (03/16/2009)   TG: 54.0 (03/16/2009)  Problem # 3:  HYPOTHYROIDISM (ICD-244.9) labs Her updated medication list for this problem includes:    Synthroid 150 Mcg Tabs (Levothyroxine sodium) .Marland Kitchen... 1 by mouth once daily  Orders: Venipuncture (16109) TLB-TSH (Thyroid Stimulating Hormone) (84443-TSH)  Labs Reviewed: TSH: 0.63 (03/16/2009)    HgBA1c: 5.6 (03/16/2009) Chol: 129 (03/16/2009)   HDL: 46.70 (03/16/2009)   LDL: 72 (03/16/2009)   TG: 54.0 (03/16/2009)  Problem # 4:  ALLERGIC RHINITIS (ICD-477.9) mild symptoms, mostly ocular, better w/  over-the-counter eyedrops. Her updated medication list for this problem includes:    Zyrtec Allergy 10 Mg Tabs (Cetirizine hcl) .Marland Kitchen... Prn  Allegra 180 Mg Tabs (Fexofenadine hcl) .Marland Kitchen... Prn  Complete Medication List: 1)  Diovan Hct 80-12.5 Mg Tabs (Valsartan-hydrochlorothiazide) .Marland Kitchen.. 1 by mouth once daily 2)  Aspirin 81 Mg Tbec (Aspirin) 3)  Synthroid 150 Mcg Tabs (Levothyroxine sodium) .Marland Kitchen.. 1 by mouth once daily 4)  K-dur   .... 1 by mouth qd 5)  Calcium  6)  Vitamin D  7)  Singulair 10 Mg Tabs (Montelukast sodium) .Marland Kitchen.. 1 by mouth once daily 8)  Zyrtec Allergy 10 Mg Tabs (Cetirizine hcl) .... Prn 9)  Allegra 180 Mg Tabs (Fexofenadine hcl) .... Prn 10)  Premarin 0.3 Mg Tabs (Estrogens conjugated) .... Per gyn  Patient Instructions: 1)  Please schedule a follow-up appointment in 6 months  (fasting, physical exam)

## 2010-09-07 NOTE — Progress Notes (Signed)
Summary: Meds- FYI  Phone Note Call from Patient Call back at [phone]   Summary of Call: Pt called and left a voicemail stating she wanted to talk to someone regarding accidently taking her Synthroid twice today. I called the pt back on the number she requested [place] to call her on, left a message for pt to call back. Army Fossa CMA  March 17, 2010 1:43 PM   Follow-up for Phone Call        Spoke with pt informed her she may feel a little fluttered but it should not cause any major S/e. Pt is feeling fine at the moment.  Follow-up by: Army Fossa CMA,  March 17, 2010 1:59 PM  Additional Follow-up for Phone Call Additional follow up Details #1::        she should be fine. Additional Follow-up by: Nolon Rod. Paz MD,  March 17, 2010 3:00 PM

## 2010-09-07 NOTE — Assessment & Plan Note (Signed)
Summary: flu shot/cbs   Nurse Visit   Allergies: No Known Drug Allergies  Orders Added: 1)  Admin 1st Vaccine [90471] 2)  Flu Vaccine 24yrs + [52841] Flu Vaccine Consent Questions     Do you have a history of severe allergic reactions to this vaccine? no    Any prior history of allergic reactions to egg and/or gelatin? no    Do you have a sensitivity to the preservative Thimersol? no    Do you have a past history of Guillan-Barre Syndrome? no    Do you currently have an acute febrile illness? no    Have you ever had a severe reaction to latex? no    Vaccine information given and explained to patient? yes    Are you currently pregnant? no    Lot Number:[id number]   Exp Date:02/05/2011   Site Given  Left Deltoid IM

## 2010-09-07 NOTE — Letter (Signed)
Summary: [place] No Show Letter  [facility] [place]  [address] [place], [place] [address]   Phone: [phone]  Fax: [phone]    11/24/2009 MRN: [id number]  Lucille TALIAFERRO [address] [place], [place]  [address]   Dear Ms. Spearing,   Our records indicate that you missed your scheduled appointment with Dr. Drue Novel on 11/24/09.  Please contact this office to reschedule your appointment as soon as possible.  It is important that you keep your scheduled appointments with your physician, so we can provide you the best care possible.  Please be advised that there may be a charge for "no show" appointments.    Sincerely,   [facility]

## 2010-09-23 ENCOUNTER — Ambulatory Visit (INDEPENDENT_AMBULATORY_CARE_PROVIDER_SITE_OTHER): Payer: BC Managed Care – PPO | Admitting: Internal Medicine

## 2010-09-23 ENCOUNTER — Encounter: Payer: Self-pay | Admitting: Internal Medicine

## 2010-09-23 ENCOUNTER — Ambulatory Visit: Payer: Self-pay | Admitting: Internal Medicine

## 2010-09-23 DIAGNOSIS — M7660 Achilles tendinitis, unspecified leg: Secondary | ICD-10-CM | POA: Insufficient documentation

## 2010-09-23 LAB — HM DIABETES FOOT EXAM

## 2010-09-29 NOTE — Assessment & Plan Note (Signed)
Summary: not feeling well-needed later appointment/nta   Vital Signs:  Patient profile:   55 year old female Height:      62.5 inches Weight:      256.38 pounds BMI:     46.31 Temp:     98.1 degrees F oral Pulse rate:   84 / minute Pulse rhythm:   regular BP sitting:   130 / 84  (left arm) Cuff size:   large  Vitals Entered By: Army Fossa CMA (September 23, 2010 11:47 AM) CC: Pt c/o (L) heel pain- hurts at night Comments kmart [facility]    History of Present Illness: 3 days history of pain at the distal Achilles tendon. No swelling. No injury that she can tell  ROS No calf swelling or redness. sometimes discomfort in the calves if  she is "standing the whole day"   Current Medications (verified): 1)  Diovan Hct 80-12.5 Mg  Tabs (Valsartan-Hydrochlorothiazide) .Marland Kitchen.. 1 By Mouth Once Daily 2)  Aspirin 81 Mg  Tbec (Aspirin) 3)  Synthroid 150 Mcg Tabs (Levothyroxine Sodium) .Marland Kitchen.. 1 By Mouth Once Daily 4)  K-Dur .... 1 By Mouth Qd 5)  Singulair 10 Mg  Tabs (Montelukast Sodium) .Marland Kitchen.. 1 By Mouth Once Daily 6)  Zyrtec Allergy 10 Mg  Tabs (Cetirizine Hcl) .... Prn 7)  Allegra 180 Mg  Tabs (Fexofenadine Hcl) .... Prn 8)  Premarin 0.3 Mg Tabs (Estrogens Conjugated) .... Per Gyn  Allergies (verified): No Known Drug Allergies  Past History:  Past Medical History: Reviewed history from 06/11/2010 and no changes required. DIABETES MELLITUS, TYPE II   HYPERTENSION   ASTHMA  , PULMONARY FUNCTION TESTS 09-2006: mild asthma  HYPOTHYROIDISM  h/o ANEMIA  started HRT after hysterectomy 2011 ALLERGIC RHINITIS  heart murmur? ECHO 5-10 was neg    Past Surgical History: Reviewed history from 12/09/2009 and no changes required. Tonsillectomy Hysterectomy, no oophorectomy (2011)  Social History: Reviewed history from 06/11/2010 and no changes required. Married one child Occupation: Airline pilot , active  Tobacco-- never ETOH-- socially diet--trying to [employer] healthier than before   (husband has DM) Regular exercise-- will try to exercise daily  Physical Exam  General:  alert, well-developed, and well-nourished.   Pulses:  normal pedal pulses bilaterally  Extremities:  no pretibial edema bilaterally  inspection and palpation of the right heel is normal; Inspection and palpation of the left heel is normal except for mild tenderness at the distal Achilles tendon, no disruption in the tendon no redness or swelling  Diabetes Management Exam:    Foot Exam (with socks and/or shoes not present):       Sensory-Pinprick/Light touch:          Left medial foot (L-4): normal          Left dorsal foot (L-5): normal          Left lateral foot (S-1): normal          Right medial foot (L-4): normal          Right dorsal foot (L-5): normal          Right lateral foot (S-1): normal       Sensory-Monofilament:          Left foot: normal          Right foot: normal       Inspection:          Left foot: normal          Right foot: normal  Nails:          Left foot: normal          Right foot: normal   Impression & Recommendations:  Problem # 1:  ACHILLES TENDINITIS (ICD-726.71) Assessment New Symptoms consistent with Achilles tendinitis Showed  the patient how to do gentle  stretch the area, see instructions  Complete Medication List: 1)  Diovan Hct 80-12.5 Mg Tabs (Valsartan-hydrochlorothiazide) .Marland Kitchen.. 1 by mouth once daily 2)  Aspirin 81 Mg Tbec (Aspirin) 3)  Synthroid 150 Mcg Tabs (Levothyroxine sodium) .Marland Kitchen.. 1 by mouth once daily 4)  K-dur  .... 1 by mouth qd 5)  Singulair 10 Mg Tabs (Montelukast sodium) .Marland Kitchen.. 1 by mouth once daily 6)  Zyrtec Allergy 10 Mg Tabs (Cetirizine hcl) .... Prn 7)  Allegra 180 Mg Tabs (Fexofenadine hcl) .... Prn 8)  Premarin 0.3 Mg Tabs (Estrogens conjugated) .... Per gyn  Patient Instructions: 1)  heel wedge for 2 weeks 2)  ice 3)  motrin 200mg  OTC 1 or 2  tabs  three times a day with food as needed for pain. Watch for stomach  irritation 4)  call if no better in 2 weeks    Orders Added: 1)  Est. Patient Level III [91478]

## 2010-10-24 LAB — CBC
Hemoglobin: 11.2 g/dL — ABNORMAL LOW (ref 12.0–15.0)
MCHC: 32.5 g/dL (ref 30.0–36.0)
MCHC: 33 g/dL (ref 30.0–36.0)
MCV: 87.7 fL (ref 78.0–100.0)
MCV: 88.1 fL (ref 78.0–100.0)
Platelets: 260 10*3/uL (ref 150–400)
RBC: 3.87 MIL/uL (ref 3.87–5.11)
RDW: 14.9 % (ref 11.5–15.5)
WBC: 24.2 10*3/uL — ABNORMAL HIGH (ref 4.0–10.5)

## 2010-10-24 LAB — COMPREHENSIVE METABOLIC PANEL
Albumin: 3.3 g/dL — ABNORMAL LOW (ref 3.5–5.2)
CO2: 27 mEq/L (ref 19–32)
Creatinine, Ser: 0.67 mg/dL (ref 0.4–1.2)
GFR calc Af Amer: >60 mL/min (ref >60)
Potassium: 3.7 mEq/L (ref 3.5–5.1)
Sodium: 135 mEq/L (ref 135–145)
Total Protein: 6.8 g/dL (ref 6.0–8.3)

## 2010-10-24 LAB — BASIC METABOLIC PANEL
Calcium: 8.9 mg/dL (ref 8.4–10.5)
Chloride: 104 mEq/L (ref 96–112)
GFR calc Af Amer: >60 mL/min (ref >60)
GFR calc non Af Amer: >60 mL/min (ref >60)

## 2010-11-22 ENCOUNTER — Other Ambulatory Visit (HOSPITAL_COMMUNITY): Payer: Self-pay | Admitting: Obstetrics and Gynecology

## 2010-11-22 DIAGNOSIS — Z1231 Encounter for screening mammogram for malignant neoplasm of breast: Secondary | ICD-10-CM

## 2010-12-08 ENCOUNTER — Encounter: Payer: BC Managed Care – PPO | Admitting: Internal Medicine

## 2010-12-08 ENCOUNTER — Encounter: Payer: Self-pay | Admitting: Internal Medicine

## 2010-12-08 DIAGNOSIS — Z0289 Encounter for other administrative examinations: Secondary | ICD-10-CM

## 2010-12-14 ENCOUNTER — Ambulatory Visit: Payer: Self-pay | Admitting: Internal Medicine

## 2010-12-14 DIAGNOSIS — Z0289 Encounter for other administrative examinations: Secondary | ICD-10-CM

## 2010-12-16 ENCOUNTER — Ambulatory Visit (HOSPITAL_COMMUNITY)
Admission: RE | Admit: 2010-12-16 | Discharge: 2010-12-16 | Disposition: A | Discharge status: Home / Self Care | Payer: BC Managed Care – PPO | Source: Ambulatory Visit | Attending: Obstetrics and Gynecology | Admitting: Obstetrics and Gynecology

## 2010-12-16 DIAGNOSIS — Z1231 Encounter for screening mammogram for malignant neoplasm of breast: Secondary | ICD-10-CM | POA: Insufficient documentation

## 2010-12-24 ENCOUNTER — Other Ambulatory Visit: Payer: Self-pay | Admitting: *Deleted

## 2010-12-24 MED ORDER — LEVOTHYROXINE SODIUM 150 MCG PO TABS: 150.0000 ug | ORAL_TABLET | Freq: Every day | ORAL | Status: DC

## 2010-12-29 ENCOUNTER — Ambulatory Visit (INDEPENDENT_AMBULATORY_CARE_PROVIDER_SITE_OTHER): Payer: BC Managed Care – PPO | Admitting: Internal Medicine

## 2010-12-29 ENCOUNTER — Encounter: Payer: Self-pay | Admitting: Internal Medicine

## 2010-12-29 DIAGNOSIS — M7660 Achilles tendinitis, unspecified leg: Secondary | ICD-10-CM

## 2010-12-29 DIAGNOSIS — E039 Hypothyroidism, unspecified: Secondary | ICD-10-CM

## 2010-12-29 DIAGNOSIS — I10 Essential (primary) hypertension: Secondary | ICD-10-CM

## 2010-12-29 DIAGNOSIS — E119 Type 2 diabetes mellitus without complications: Secondary | ICD-10-CM

## 2010-12-29 MED ORDER — VALSARTAN-HYDROCHLOROTHIAZIDE 80-12.5 MG PO TABS: 1.0000 | ORAL_TABLET | Freq: Every day | ORAL | Status: DC

## 2010-12-29 MED ORDER — POTASSIUM CHLORIDE CRYS ER 20 MEQ PO TBCR: 20.0000 meq | EXTENDED_RELEASE_TABLET | Freq: Every day | ORAL | Status: DC

## 2010-12-29 MED ORDER — LEVOTHYROXINE SODIUM 150 MCG PO TABS: 150.0000 ug | ORAL_TABLET | Freq: Every day | ORAL | Status: DC

## 2010-12-29 MED ORDER — MONTELUKAST SODIUM 10 MG PO TABS: 10.0000 mg | ORAL_TABLET | Freq: Every day | ORAL | Status: DC

## 2010-12-29 NOTE — Assessment & Plan Note (Addendum)
Has changed her habits, lost 5 pounds!

## 2010-12-29 NOTE — Assessment & Plan Note (Signed)
Not improving, refer to ortho (states is leaving town will call when ready0 rec to cont stretching

## 2010-12-29 NOTE — Assessment & Plan Note (Signed)
Well controlled, labs and RF

## 2010-12-29 NOTE — Assessment & Plan Note (Signed)
Good compliance, labs, RF

## 2010-12-29 NOTE — Progress Notes (Signed)
  Subjective:    Patient ID: Taylor Ramirez, female    DOB: 06/03/1956, 55 y.o.   MRN: [id number]  HPI Routine office visit Hypertension, good medication compliance, ambulatory blood pressure very well controlled. Hypothyroidism, good medication compliance, needs a refill. She was seen a few months ago with Achilles tendon tendinitis on the left, was recommended stretching, pain has not improved much.  Past Medical History  Diagnosis Date  . Diabetes mellitus   . Hypertension   . Asthma     Pulmonary function Tests 09-2006: mild asthma  . Hypothyroidism   . Anemia   . 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 Allergic rhinitis   . Heart murmur     ECHO 5/10 was neg   Past Surgical History  Procedure Date  . Tonsillectomy   . Abdominal hysterectomy 2011    no oophorectomy    Review of Systems Denies chest or shortness of breath Her diet has improved, she is trying to be more active.  Reports she has lost some weight. No amb CBGs. Chart reviewed, she has decreased from 256 pounds to 251 pounds    Objective:   Physical Exam  Constitutional: She is oriented to person, place, and time. She appears well-developed and well-nourished.  Cardiovascular: Normal rate, regular rhythm and normal heart sounds.   No murmur heard. Pulmonary/Chest: Effort normal and breath sounds normal. No respiratory distress. She has no wheezes. She has no rales.  Musculoskeletal: She exhibits no edema.       Slightly tender at the insertion of the Achilles tendon in the left heel. The calf is otherwise symmetric and nontender.  Neurological: She is alert and oriented to person, place, and time.  Psychiatric: She has a normal mood and affect. Her behavior is normal. Judgment and thought content normal.          Assessment & Plan:

## 2011-01-07 ENCOUNTER — Telehealth: Payer: Self-pay | Admitting: Internal Medicine

## 2011-01-07 NOTE — Telephone Encounter (Signed)
Was recommended a BMP and a TSH several days ago. Please arrange

## 2011-01-07 NOTE — Telephone Encounter (Signed)
Message left for patient to return my call.  (orders are in computer)

## 2011-01-07 NOTE — Progress Notes (Signed)
Labs not done, we are calling her today.

## 2011-01-10 ENCOUNTER — Other Ambulatory Visit: Payer: Self-pay | Admitting: *Deleted

## 2011-01-10 DIAGNOSIS — I10 Essential (primary) hypertension: Secondary | ICD-10-CM

## 2011-01-10 DIAGNOSIS — E119 Type 2 diabetes mellitus without complications: Secondary | ICD-10-CM

## 2011-01-10 DIAGNOSIS — M7660 Achilles tendinitis, unspecified leg: Secondary | ICD-10-CM

## 2011-01-10 DIAGNOSIS — E039 Hypothyroidism, unspecified: Secondary | ICD-10-CM

## 2011-01-10 NOTE — Telephone Encounter (Signed)
Pt is aware, appt made 

## 2011-01-11 ENCOUNTER — Other Ambulatory Visit (INDEPENDENT_AMBULATORY_CARE_PROVIDER_SITE_OTHER): Payer: BC Managed Care – PPO

## 2011-01-11 DIAGNOSIS — E039 Hypothyroidism, unspecified: Secondary | ICD-10-CM

## 2011-01-11 DIAGNOSIS — E119 Type 2 diabetes mellitus without complications: Secondary | ICD-10-CM

## 2011-01-11 DIAGNOSIS — M7660 Achilles tendinitis, unspecified leg: Secondary | ICD-10-CM

## 2011-01-11 DIAGNOSIS — I10 Essential (primary) hypertension: Secondary | ICD-10-CM

## 2011-01-11 LAB — TSH: TSH: 0.94 u[IU]/mL (ref 0.35–5.50)

## 2011-01-11 LAB — BASIC METABOLIC PANEL
CO2: 27 mEq/L (ref 19–32)
Chloride: 102 mEq/L (ref 96–112)
Creatinine, Ser: 0.6 mg/dL (ref 0.4–1.2)
Potassium: 3.9 mEq/L (ref 3.5–5.1)

## 2011-01-11 NOTE — Progress Notes (Signed)
Labs only

## 2011-01-11 NOTE — Progress Notes (Signed)
This encounter was created in error - please disregard.

## 2011-01-16 ENCOUNTER — Telehealth: Payer: Self-pay | Admitting: Internal Medicine

## 2011-01-16 NOTE — Telephone Encounter (Signed)
Advise patient::labs ok

## 2011-01-17 ENCOUNTER — Other Ambulatory Visit: Payer: Self-pay | Admitting: Internal Medicine

## 2011-01-17 MED ORDER — VALSARTAN-HYDROCHLOROTHIAZIDE 80-12.5 MG PO TABS: 1.0000 | ORAL_TABLET | Freq: Every day | ORAL | Status: DC

## 2011-01-17 NOTE — Telephone Encounter (Signed)
Message left for patient to return my call.  

## 2011-01-17 NOTE — Telephone Encounter (Signed)
Sent in

## 2011-01-18 NOTE — Telephone Encounter (Signed)
Message left for patient to return my call.  

## 2011-01-19 NOTE — Telephone Encounter (Signed)
Message left for patient to return my call.  

## 2011-01-19 NOTE — Telephone Encounter (Signed)
Pt is aware.  

## 2011-05-06 ENCOUNTER — Encounter: Payer: Self-pay | Admitting: Internal Medicine

## 2011-05-06 ENCOUNTER — Ambulatory Visit (INDEPENDENT_AMBULATORY_CARE_PROVIDER_SITE_OTHER): Payer: BC Managed Care – PPO | Admitting: Internal Medicine

## 2011-05-06 DIAGNOSIS — J309 Allergic rhinitis, unspecified: Secondary | ICD-10-CM

## 2011-05-06 MED ORDER — FEXOFENADINE HCL 180 MG PO TABS: 180.0000 mg | ORAL_TABLET | Freq: Every day | ORAL | Status: DC

## 2011-05-06 MED ORDER — MOMETASONE FUROATE 50 MCG/ACT NA SUSP: 2.0000 | Freq: Every day | NASAL | Status: DC

## 2011-05-06 MED ORDER — MONTELUKAST SODIUM 10 MG PO TABS: 10.0000 mg | ORAL_TABLET | Freq: Every day | ORAL | Status: DC

## 2011-05-06 NOTE — Assessment & Plan Note (Signed)
Allergy symptoms not well controlled, will restart Allegra, singulair . We'll also add Nasonex, samples and a prescription provided. Patient will call in few days if she's not improving.

## 2011-05-06 NOTE — Patient Instructions (Signed)
Call if no better in few days

## 2011-05-06 NOTE — Progress Notes (Signed)
  Subjective:    Patient ID: Taylor Ramirez, female    DOB: [birth year], 55 y.o.   MRN: [id number]  HPI 2 weeks history of sinus congestion, blowing some nasal discharge, discharge is sometimes colored. Ears itching a little bit, she has noted wheezing at night along with some cough.   Past Medical History  Diagnosis Date  . Diabetes mellitus   . Hypertension   . Asthma     Pulmonary function Tests 09-2006: mild asthma  . Hypothyroidism   . Anemia   . 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 Allergic rhinitis   . Heart murmur     ECHO 5/10 was neg   Past Surgical History  Procedure Date  . Tonsillectomy   . Abdominal hysterectomy 2011    no oophorectomy     Review of Systems No fever or chills, she does have itchy eyes. In the past she used to take a nasal spray that helped great deal. Her medication list includes Allegra and Zyrtec, she prefers Allegra    Objective:   Physical Exam  Constitutional: She appears well-developed. No distress.  HENT:  Head: Normocephalic and atraumatic.  Right Ear: External ear normal.  Left Ear: External ear normal.       Face symmetric, nontender to palpation. Nose congested. Oropharynx without redness or discharge.  Cardiovascular: Normal rate, regular rhythm and normal heart sounds.   No murmur heard. Pulmonary/Chest: Effort normal and breath sounds normal. No respiratory distress. She has no wheezes. She has no rales.  Musculoskeletal: She exhibits no edema.  Skin: She is not diaphoretic.          Assessment & Plan:

## 2011-05-13 ENCOUNTER — Ambulatory Visit: Payer: BC Managed Care – PPO | Admitting: Internal Medicine

## 2011-05-20 ENCOUNTER — Encounter: Payer: BC Managed Care – PPO | Admitting: Internal Medicine

## 2011-05-20 ENCOUNTER — Encounter: Payer: Self-pay | Admitting: Internal Medicine

## 2011-05-20 NOTE — Progress Notes (Signed)
  This encounter was created in error - please disregard. Patient Left without being seen/rescheduled.

## 2011-05-24 ENCOUNTER — Ambulatory Visit: Payer: BC Managed Care – PPO | Admitting: Internal Medicine

## 2011-05-26 ENCOUNTER — Other Ambulatory Visit: Payer: Self-pay | Admitting: Internal Medicine

## 2011-05-26 MED ORDER — VALSARTAN-HYDROCHLOROTHIAZIDE 80-12.5 MG PO TABS: 1.0000 | ORAL_TABLET | Freq: Every day | ORAL | Status: DC

## 2011-05-26 NOTE — Telephone Encounter (Signed)
Done

## 2011-05-30 ENCOUNTER — Encounter: Payer: Self-pay | Admitting: Internal Medicine

## 2011-05-30 ENCOUNTER — Ambulatory Visit (INDEPENDENT_AMBULATORY_CARE_PROVIDER_SITE_OTHER): Payer: BC Managed Care – PPO | Admitting: Internal Medicine

## 2011-05-30 VITALS — BP 142/76 | HR 88 | Temp 97.5°F | Resp 20 | Ht 67.0 in | Wt 265.0 lb

## 2011-05-30 DIAGNOSIS — E039 Hypothyroidism, unspecified: Secondary | ICD-10-CM

## 2011-05-30 DIAGNOSIS — I10 Essential (primary) hypertension: Secondary | ICD-10-CM

## 2011-05-30 DIAGNOSIS — J309 Allergic rhinitis, unspecified: Secondary | ICD-10-CM

## 2011-05-30 DIAGNOSIS — E119 Type 2 diabetes mellitus without complications: Secondary | ICD-10-CM

## 2011-05-30 DIAGNOSIS — J45909 Unspecified asthma, uncomplicated: Secondary | ICD-10-CM

## 2011-05-30 DIAGNOSIS — D649 Anemia, unspecified: Secondary | ICD-10-CM

## 2011-05-30 MED ORDER — BUDESONIDE-FORMOTEROL FUMARATE 80-4.5 MCG/ACT IN AERO: 2.0000 | INHALATION_SPRAY | Freq: Every day | RESPIRATORY_TRACT | Status: DC

## 2011-05-30 NOTE — Patient Instructions (Signed)
Start symbicort 2 puffs at night, call if the wheezing is not better Come back fasting: FLP, A1C---diabetes BMP--- dx HTN CBC---dx anemia

## 2011-05-30 NOTE — Assessment & Plan Note (Signed)
No change, lab

## 2011-05-30 NOTE — Assessment & Plan Note (Signed)
Diet control, labs 

## 2011-05-30 NOTE — Assessment & Plan Note (Signed)
Improved

## 2011-05-30 NOTE — Progress Notes (Signed)
  Subjective:    Patient ID: Taylor Ramirez, female    DOB: Jan 10, 1956, 55 y.o.   MRN: [id number]  HPI ROV Allergies and asthma--was seen recently with upper respiratory sx consistent with allergies, that is much better, she still has some wheezing at night. 2 weeks ago, had tingling in the right wrist and hand. Symptoms essentially resolved, very rarely  now has some tingling at the palmar aspect of the right hand. Diabetes--diet has improved compared to a few months ago Hypertension--good medication compliance  Past Medical History  Diagnosis Date  . Diabetes mellitus   . Hypertension   . Asthma     Pulmonary function Tests 09-2006: mild asthma  . Hypothyroidism   . Anemia   . 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 Allergic rhinitis   . Heart murmur     ECHO 5/10 was neg   Past Surgical History  Procedure Date  . Tonsillectomy   . Abdominal hysterectomy 2011    no oophorectomy    Review of Systems No chest pain or shortness of breath, no orthopnea. No lower extremity edema. No nausea or vomiting, occasionally has diarrhea but that is rare.     Objective:   Physical Exam  Constitutional: She is oriented to person, place, and time. She appears well-developed and well-nourished. No distress.  HENT:  Nose: Nose normal.  Cardiovascular: Normal rate, regular rhythm and normal heart sounds.   No murmur heard. Pulmonary/Chest: Effort normal and breath sounds normal. No respiratory distress. She has no wheezes. She has no rales.  Musculoskeletal: She exhibits no edema.       Inspection and palpation of the hands and wrists normal  Neurological: She is alert and oriented to person, place, and time.       Motor strength and DTRs normal. Specifically grip normal bilaterally.  Skin: She is not diaphoretic.  Psychiatric: She has a normal mood and affect. Her behavior is normal. Judgment and thought content normal.          Assessment & Plan:    Tingling at the right hand: Denies neck pain, neuro exam normal, recommend observation.

## 2011-05-30 NOTE — Assessment & Plan Note (Addendum)
Well-controlled per last TSH

## 2011-05-30 NOTE — Assessment & Plan Note (Signed)
Mild anemia, last hemoglobin around 11.9. Labs

## 2011-05-30 NOTE — Assessment & Plan Note (Addendum)
Continue with nocturnal wheezing, no evidence of volume overload. No chest pain. Historically her symptoms have always been nocturnal. Plan: symbicort  If no better ? PPIs

## 2011-05-31 ENCOUNTER — Other Ambulatory Visit: Payer: BC Managed Care – PPO

## 2011-06-01 ENCOUNTER — Other Ambulatory Visit: Payer: Self-pay | Admitting: Internal Medicine

## 2011-06-01 DIAGNOSIS — D649 Anemia, unspecified: Secondary | ICD-10-CM

## 2011-06-01 DIAGNOSIS — E119 Type 2 diabetes mellitus without complications: Secondary | ICD-10-CM

## 2011-06-01 DIAGNOSIS — E039 Hypothyroidism, unspecified: Secondary | ICD-10-CM

## 2011-06-01 DIAGNOSIS — I10 Essential (primary) hypertension: Secondary | ICD-10-CM

## 2011-06-02 ENCOUNTER — Other Ambulatory Visit (INDEPENDENT_AMBULATORY_CARE_PROVIDER_SITE_OTHER): Payer: BC Managed Care – PPO

## 2011-06-02 ENCOUNTER — Encounter: Payer: Self-pay | Admitting: *Deleted

## 2011-06-02 ENCOUNTER — Encounter: Payer: Self-pay | Admitting: Internal Medicine

## 2011-06-02 ENCOUNTER — Telehealth: Payer: Self-pay | Admitting: *Deleted

## 2011-06-02 DIAGNOSIS — E039 Hypothyroidism, unspecified: Secondary | ICD-10-CM

## 2011-06-02 DIAGNOSIS — E119 Type 2 diabetes mellitus without complications: Secondary | ICD-10-CM

## 2011-06-02 DIAGNOSIS — I10 Essential (primary) hypertension: Secondary | ICD-10-CM

## 2011-06-02 DIAGNOSIS — D649 Anemia, unspecified: Secondary | ICD-10-CM

## 2011-06-02 LAB — CBC WITH DIFFERENTIAL/PLATELET
Basophils Absolute: 0.1 10*3/uL (ref 0.0–0.1)
Basophils Relative: 0.7 % (ref 0.0–3.0)
Eosinophils Absolute: 0.5 10*3/uL (ref 0.0–0.7)
Lymphocytes Relative: 27.3 % (ref 12.0–46.0)
MCHC: 32.9 g/dL (ref 30.0–36.0)
MCV: 88.5 fl (ref 78.0–100.0)
Monocytes Absolute: 0.6 10*3/uL (ref 0.1–1.0)
Neutrophils Relative %: 62.1 % (ref 43.0–77.0)
Platelets: 270 10*3/uL (ref 150.0–400.0)
RBC: 4 Mil/uL (ref 3.87–5.11)

## 2011-06-02 LAB — LIPID PANEL
HDL: 61.1 mg/dL (ref >39.00)
Total CHOL/HDL Ratio: 3
Triglycerides: 76 mg/dL (ref 0.0–149.0)
VLDL: 15.2 mg/dL (ref 0.0–40.0)

## 2011-06-02 LAB — BASIC METABOLIC PANEL
BUN: 21 mg/dL (ref 6–23)
CO2: 29 mEq/L (ref 19–32)
Calcium: 8.9 mg/dL (ref 8.4–10.5)
Creatinine, Ser: 0.7 mg/dL (ref 0.4–1.2)

## 2011-06-02 LAB — HEMOGLOBIN A1C: Hgb A1c MFr Bld: 6 % (ref 4.6–6.5)

## 2011-06-02 NOTE — Telephone Encounter (Signed)
Pt requested letter for work absence faxed to employer at [phone]. Done.

## 2011-06-02 NOTE — Progress Notes (Signed)
Labs only

## 2011-06-03 ENCOUNTER — Telehealth: Payer: Self-pay | Admitting: *Deleted

## 2011-06-03 DIAGNOSIS — D649 Anemia, unspecified: Secondary | ICD-10-CM

## 2011-06-03 LAB — FERRITIN: Ferritin: 39.6 ng/mL (ref 10.0–291.0)

## 2011-06-03 NOTE — Telephone Encounter (Signed)
LMOM to inform patient of results w/contact name & number for call back. New Rx to pharmacy. Mailed copy of results.

## 2011-06-03 NOTE — Telephone Encounter (Signed)
Message copied by Regis Bill on Fri Jun 03, 2011  5:40 PM ------      Message from: Willow Ora E      Created: Fri Jun 03, 2011  9:39 AM       Advised patient:      Cholesterol is very good      blood sugar test hemoglobin A1c is great      She does have mild but stable  anemia, please add iron-ferritin Dx anemia

## 2011-06-07 ENCOUNTER — Other Ambulatory Visit: Payer: Self-pay | Admitting: Internal Medicine

## 2011-06-07 MED ORDER — VALSARTAN-HYDROCHLOROTHIAZIDE 80-12.5 MG PO TABS: 1.0000 | ORAL_TABLET | Freq: Every day | ORAL | Status: DC

## 2011-06-07 NOTE — Telephone Encounter (Signed)
Done

## 2011-06-08 MED ORDER — VALSARTAN-HYDROCHLOROTHIAZIDE 80-12.5 MG PO TABS: 1.0000 | ORAL_TABLET | Freq: Every day | ORAL | Status: DC

## 2011-06-08 NOTE — Telephone Encounter (Signed)
Done

## 2011-06-10 ENCOUNTER — Telehealth: Payer: Self-pay | Admitting: Internal Medicine

## 2011-06-10 DIAGNOSIS — D509 Iron deficiency anemia, unspecified: Secondary | ICD-10-CM

## 2011-06-10 NOTE — Telephone Encounter (Signed)
Needs iron, ferritin-- dx anemia Please arrange (we called few days ago but apparently has not schedule labs)

## 2011-06-10 NOTE — Telephone Encounter (Signed)
Iron and ferritin ordered when asked and resulted on 06/03/11.

## 2011-06-10 NOTE — Telephone Encounter (Signed)
Advise patient, she does have mild iron deficiency, last colonoscopy in 2006. Please arrange a GI referral for repeat colonoscopy, DX iron deficiency anemia

## 2011-06-13 ENCOUNTER — Other Ambulatory Visit: Payer: Self-pay | Admitting: Internal Medicine

## 2011-06-13 ENCOUNTER — Other Ambulatory Visit: Payer: Self-pay

## 2011-06-13 MED ORDER — VALSARTAN-HYDROCHLOROTHIAZIDE 80-12.5 MG PO TABS: 1.0000 | ORAL_TABLET | Freq: Every day | ORAL | Status: DC

## 2011-06-13 MED ORDER — VALSARTAN-HYDROCHLOROTHIAZIDE 80-12.5 MG PO TABS: 12.0000 | ORAL_TABLET | Freq: Every day | ORAL | Status: DC

## 2011-06-14 ENCOUNTER — Encounter: Payer: BC Managed Care – PPO | Admitting: Internal Medicine

## 2011-06-15 NOTE — Telephone Encounter (Signed)
Left message for pt to call back to advise of lab results and GI ref

## 2011-06-16 ENCOUNTER — Encounter: Payer: Self-pay | Admitting: Internal Medicine

## 2011-06-28 ENCOUNTER — Telehealth: Payer: Self-pay | Admitting: Internal Medicine

## 2011-06-28 MED ORDER — SYNTHROID 150 MCG PO TABS: 150.0000 ug | ORAL_TABLET | Freq: Every day | ORAL | Status: DC

## 2011-06-28 NOTE — Telephone Encounter (Signed)
Done

## 2011-06-28 NOTE — Telephone Encounter (Signed)
rx was called for synthroid but only 4 pills - needs new rx 30 day supply - [address]

## 2011-07-04 ENCOUNTER — Encounter: Payer: Self-pay | Admitting: Internal Medicine

## 2011-07-04 ENCOUNTER — Ambulatory Visit (INDEPENDENT_AMBULATORY_CARE_PROVIDER_SITE_OTHER): Payer: BC Managed Care – PPO | Admitting: Internal Medicine

## 2011-07-04 DIAGNOSIS — D649 Anemia, unspecified: Secondary | ICD-10-CM

## 2011-07-04 DIAGNOSIS — J45909 Unspecified asthma, uncomplicated: Secondary | ICD-10-CM

## 2011-07-04 DIAGNOSIS — M1990 Unspecified osteoarthritis, unspecified site: Secondary | ICD-10-CM

## 2011-07-04 DIAGNOSIS — E039 Hypothyroidism, unspecified: Secondary | ICD-10-CM

## 2011-07-04 DIAGNOSIS — E119 Type 2 diabetes mellitus without complications: Secondary | ICD-10-CM

## 2011-07-04 DIAGNOSIS — Z0000 Encounter for general adult medical examination without abnormal findings: Secondary | ICD-10-CM | POA: Insufficient documentation

## 2011-07-04 NOTE — Progress Notes (Signed)
  Subjective:    Patient ID: Taylor Ramirez, female    DOB: 1956/04/17, 55 y.o.   MRN: [id number]  HPI CPX   Past Medical History  Diagnosis Date  . Diabetes mellitus   . Hypertension   . Asthma     Pulmonary function Tests 09-2006: mild asthma  . Hypothyroidism   . Anemia     h/o  . 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 Allergic rhinitis   . Heart murmur     ECHO 5/10 was neg   Past Surgical History  Procedure Date  . Tonsillectomy   . Abdominal hysterectomy 2011    no oophorectomy   History   Social History  . Marital Status: Married    Spouse Name: N/A    Number of Children: 1  . Years of Education: N/A   Occupational History  . sales,active    Social History Main Topics  . Smoking status: Never Smoker   . Smokeless tobacco: Never Used  . Alcohol Use: Yes     socially  . Drug Use: No  . Sexually Active: Not on file   Other Topics Concern  . Not on file   Social History Narrative   Diet: improved compared to previous years, low salt also (husband has DM)Regular Exercise: plans to join a gym.    Family History  Problem Relation Age of Onset  . Hypertension Father   . Hypothyroidism Mother   . Graves' disease Daughter   . Diabetes Sister   . Heart attack Neg Hx   . Colon cancer Neg Hx   . Breast cancer Neg Hx      Review of Systems No chest pain or shortness of breath Asthma has improved compared to the last time she was here, no cough. She uses symbicort as needed only. She  also had heel pain in the past , that is resolved but now she has anterior thigh pain bilaterally from time to time, and usually with walking. No claudication. Some left knee pain without swelling as well. No nausea, vomiting, diarrhea No GERD symptoms or abdominal pain No blood in the stools No anxiety  or depression. Request a referral to her podiatrist d/t callouses.     Objective:   Physical Exam  Constitutional: She is oriented to  person, place, and time. She appears well-developed and well-nourished.  HENT:  Head: Normocephalic and atraumatic.  Neck: No thyromegaly present.  Cardiovascular: Normal rate, regular rhythm and normal heart sounds.   No murmur heard. Pulmonary/Chest: Effort normal and breath sounds normal. No respiratory distress. She has no wheezes. She has no rales.  Abdominal: Soft. She exhibits no distension. There is no tenderness. There is no rebound and no guarding.  Musculoskeletal:       DIABETIC FEET EXAM: No lower extremity edema Normal pedal pulses bilaterally Skin very dry , has  calluses Pinprick examination of the feet normal.   Neurological: She is alert and oriented to person, place, and time.  Psychiatric: She has a normal mood and affect. Her behavior is normal. Judgment and thought content normal.          Assessment & Plan:

## 2011-07-04 NOTE — Assessment & Plan Note (Signed)
No change 

## 2011-07-04 NOTE — Assessment & Plan Note (Addendum)
Td 2005 got a flu shot already per pt   sees gyn elsewhere, [facility] PAPs per them   Cscope, Dr Arlyce Dice 08-2004: normal, see anemia   diet-exercise : extensive discussion about the need to improve her life style

## 2011-07-04 NOTE — Patient Instructions (Signed)
Return to the office in 4 to 5 months

## 2011-07-04 NOTE — Assessment & Plan Note (Signed)
Complaint of a crackling feeling as if he left knee and pain for 3 weeks, exam showed no effusion, range of motion is normal. Suspect underlying DJD, recommend judicious use of over-the-counter medicines and weight loss.

## 2011-07-04 NOTE — Assessment & Plan Note (Signed)
Currently well controlled  Uses symbicort prn, encouraged daily use  Had a flu shot

## 2011-07-04 NOTE — Assessment & Plan Note (Signed)
To have further eval by GI

## 2011-07-04 NOTE — Assessment & Plan Note (Signed)
Well controlled rec eye check yearly Feet care discussed, also request a referral to a podiatrist d/t callouses, will do

## 2011-07-08 ENCOUNTER — Ambulatory Visit: Payer: BC Managed Care – PPO | Admitting: Gastroenterology

## 2011-08-29 ENCOUNTER — Ambulatory Visit: Payer: BC Managed Care – PPO | Admitting: Gastroenterology

## 2011-09-03 ENCOUNTER — Telehealth: Payer: Self-pay | Admitting: Internal Medicine

## 2011-09-03 NOTE — Telephone Encounter (Signed)
Patient was seen with anemia few months ago, was supposed to have a GI evaluation, i don't see that in the chart. please contact patient, if she has not seen GI, arrange a referral JP

## 2011-09-07 NOTE — Telephone Encounter (Signed)
Left message on voicemail for patient to return call when available   

## 2011-09-19 NOTE — Telephone Encounter (Signed)
Please check on this again. 

## 2011-09-19 NOTE — Telephone Encounter (Signed)
Phoned pt again & LMOVM for her to return call.

## 2011-09-20 ENCOUNTER — Ambulatory Visit: Payer: BC Managed Care – PPO | Admitting: Gastroenterology

## 2011-09-27 ENCOUNTER — Encounter: Payer: Self-pay | Admitting: *Deleted

## 2011-09-27 NOTE — Telephone Encounter (Signed)
Please send pt a letter, ask her to call us back

## 2011-09-27 NOTE — Telephone Encounter (Signed)
Letter sent.

## 2011-10-10 ENCOUNTER — Encounter: Payer: Self-pay | Admitting: Gastroenterology

## 2011-10-10 ENCOUNTER — Ambulatory Visit (INDEPENDENT_AMBULATORY_CARE_PROVIDER_SITE_OTHER): Payer: BC Managed Care – PPO | Admitting: Gastroenterology

## 2011-10-10 VITALS — BP 142/78 | HR 88 | Ht 63.0 in | Wt 271.0 lb

## 2011-10-10 DIAGNOSIS — D649 Anemia, unspecified: Secondary | ICD-10-CM

## 2011-10-10 MED ORDER — PEG-KCL-NACL-NASULF-NA ASC-C 100 G PO SOLR: 1.0000 | Freq: Once | ORAL | Status: DC

## 2011-10-10 NOTE — Progress Notes (Signed)
History of Present Illness: Taylor Ramirez is a 56 year old Afro-American female referred at the request of Dr. Drue Novel for evaluation of anemia. He has had a stable anemia for at least 2 years. Hemoglobin in October, 2012 was 11.7. In August, 2010 it was 12.4. Serum iron was 30 and ferritin 39.6. Colonoscopy in 2006 for Hemoccult positive stool was normal.  She is on no gastric irritants including nonsteroidals. Her main complaint is intermittent diarrhea. There is no history of melena or hematochezia.    Past Medical History  Diagnosis Date  . Diabetes mellitus   . Hypertension   . Asthma     Pulmonary function Tests 09-2006: mild asthma  . Hypothyroidism   . Anemia     h/o  . 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 Allergic rhinitis   . Heart murmur     ECHO 5/10 was neg   Past Surgical History  Procedure Date  . Tonsillectomy   . Abdominal hysterectomy 2011    no oophorectomy   family history includes Diabetes in her sister; Luiz Blare' disease in her daughter; Hypertension in her father; and Hypothyroidism in her mother.  There is no history of Heart attack, and Colon cancer, and Breast cancer, . Current Outpatient Prescriptions  Medication Sig Dispense Refill  . aspirin 81 MG tablet Take 81 mg by mouth daily.        . budesonide-formoterol (SYMBICORT) 80-4.5 MCG/ACT inhaler Inhale 2 puffs into the lungs at bedtime.  1 Inhaler  12  . estrogens, conjugated, (PREMARIN) 0.3 MG tablet Take 0.3 mg by mouth daily. Take daily for 21 days then do not take for 7 days.       . fexofenadine (ALLEGRA) 180 MG tablet Take 1 tablet (180 mg total) by mouth daily.  30 tablet  12  . mometasone (NASONEX) 50 MCG/ACT nasal spray Place 2 sprays into the nose daily.  17 g  12  . montelukast (SINGULAIR) 10 MG tablet Take 1 tablet (10 mg total) by mouth at bedtime.  30 tablet  12  . potassium chloride SA (K-DUR,KLOR-CON) 20 MEQ tablet Take 1 tablet (20 mEq total) by mouth daily.  30  tablet  4  . SYNTHROID 150 MCG tablet Take 1 tablet (150 mcg total) by mouth daily.  30 tablet  3  . valsartan-hydrochlorothiazide (DIOVAN-HCT) 80-12.5 MG per tablet Take 1 tablet by mouth daily.  90 tablet  12   Allergies as of 10/10/2011  . (No Known Allergies)    reports that she has never smoked. She has never used smokeless tobacco. She reports that she drinks alcohol. She reports that she does not use illicit drugs.     Review of Systems: Pertinent positive and negative review of systems were noted in the above HPI section. All other review of systems were otherwise negative.  Vital signs were reviewed in today's medical record Physical Exam: General: Well developed , well nourished, no acute distress Head: Normocephalic and atraumatic Eyes:  sclerae anicteric, EOMI Ears: Normal auditory acuity Mouth: No deformity or lesions Neck: Supple, no masses or thyromegaly Lungs: Clear throughout to auscultation Heart: Regular rate and rhythm; no murmurs, rubs or bruits Abdomen: Soft, non tender and non distended. No masses, hepatosplenomegaly or hernias noted. Normal Bowel sounds Rectal:deferred Musculoskeletal: Symmetrical with no gross deformities  Skin: No lesions on visible extremities Pulses:  Normal pulses noted Extremities: No clubbing, cyanosis, edema or deformities noted Neurological: Alert oriented x 4, grossly nonfocal  Cervical Nodes:  No significant cervical adenopathy Inguinal Nodes: No significant inguinal adenopathy Psychological:  Alert and cooperative. Normal mood and affect

## 2011-10-10 NOTE — Assessment & Plan Note (Addendum)
Anemia is probably multifactorial but not clearly iron deficient. It is noteworthy, however, that she was Hemoccult positive in 2006 prompting colonoscopy. Endoscopy was recommended but this was not done.  Recommendations #1 colonoscopy and upper endoscopy-to be done the same time #2 serial Hemoccults

## 2011-10-10 NOTE — Patient Instructions (Signed)
Your Colonoscopy/Endoscopy is scheduled on 10/17/2011 at 2pm on the 4th floor Separate instructions has been given You need to go to the basement for your lab kit

## 2011-10-17 ENCOUNTER — Other Ambulatory Visit: Payer: BC Managed Care – PPO

## 2011-10-17 ENCOUNTER — Encounter: Payer: Self-pay | Admitting: Gastroenterology

## 2011-10-17 ENCOUNTER — Ambulatory Visit (AMBULATORY_SURGERY_CENTER): Payer: BC Managed Care – PPO | Admitting: Gastroenterology

## 2011-10-17 VITALS — BP 148/94 | HR 75 | Temp 98.2°F | Resp 13 | Ht 63.0 in | Wt 271.0 lb

## 2011-10-17 DIAGNOSIS — D649 Anemia, unspecified: Secondary | ICD-10-CM

## 2011-10-17 DIAGNOSIS — K2990 Gastroduodenitis, unspecified, without bleeding: Secondary | ICD-10-CM

## 2011-10-17 DIAGNOSIS — D126 Benign neoplasm of colon, unspecified: Secondary | ICD-10-CM

## 2011-10-17 DIAGNOSIS — K2970 Gastritis, unspecified, without bleeding: Secondary | ICD-10-CM

## 2011-10-17 MED ORDER — SODIUM CHLORIDE 0.9 % IV SOLN: 500.0000 mL | INTRAVENOUS | Status: DC

## 2011-10-17 NOTE — Op Note (Signed)
[facility] [address] [place], [place]  [address]  ENDOSCOPY PROCEDURE REPORT  PATIENT:  Taylor Ramirez, Taylor Ramirez  MR#:  [id number] BIRTHDATE:  [birth year], 55 yrs. old  GENDER:  female  ENDOSCOPIST:  Barbette Hair. Arlyce Dice, MD Referred by:  PROCEDURE DATE:  10/17/2011 PROCEDURE:  EGD with biopsy, 19147 ASA CLASS:  Class II INDICATIONS:  anemia  MEDICATIONS:   There was residual sedation effect present from prior procedure., MAC sedation, administered by CRNA propofol 80mg IV, glycopyrrolate (Robinal) 0.2 mg IV, 0.6cc simethancone 0.6 cc PO TOPICAL ANESTHETIC:  DESCRIPTION OF PROCEDURE:   After the risks and benefits of the procedure were explained, informed consent was obtained.  The LB GIF-H180 [id number] endoscope was introduced through the mouth and advanced to the third portion of the duodenum.  The instrument was slowly withdrawn as the mucosa was fully examined. <<PROCEDUREIMAGES>>  Mild gastritis was found in the cardia. Mild mucosal edema. Bxs taken (see image3).  Otherwise the examination was normal (see image1 and image2).    Retroflexed views revealed no abnormalities.    The scope was then withdrawn from the patient and the procedure completed.  COMPLICATIONS:  None  ENDOSCOPIC IMPRESSION: 1) Mild gastritis in the cardia 2) Otherwise normal examination RECOMMENDATIONS: 1) hemmoccult stools in 1 weeks  ______________________________ Barbette Hair. Arlyce Dice, MD  CC:  Willow Ora, MD  n. Rosalie Doctor:   Barbette Hair. Madline Oesterling at 10/17/2011 02:24 PM  [place], [phone]

## 2011-10-17 NOTE — Progress Notes (Signed)
Patient did not experience any of the following events: a burn prior to discharge; a fall within the facility; wrong site/side/patient/procedure/implant event; or a hospital transfer or hospital admission upon discharge from the facility. (G8907) Patient did not have preoperative order for IV antibiotic SSI prophylaxis. (G8918)  

## 2011-10-17 NOTE — Op Note (Signed)
[facility] [address] [place], [place]  [address]  COLONOSCOPY PROCEDURE REPORT  PATIENT:  Taylor Ramirez, Taylor Ramirez  MR#:  [id number] BIRTHDATE:  1956-02-13, 55 yrs. old  GENDER:  female ENDOSCOPIST:  Barbette Hair. Arlyce Dice, MD REF. BY: PROCEDURE DATE:  10/17/2011 PROCEDURE:  Colonoscopy with snare polypectomy ASA CLASS:  Class II INDICATIONS:  Anemia MEDICATIONS:   MAC sedation, administered by CRNA propofol 270mg IV  DESCRIPTION OF PROCEDURE:   After the risks benefits and alternatives of the procedure were thoroughly explained, informed consent was obtained.  Digital rectal exam was performed and revealed no abnormalities.   The LB 180AL [id number] endoscope was introduced through the anus and advanced to the cecum, which was identified by both the appendix and ileocecal valve, without limitations.  The quality of the prep was excellent, using MoviPrep.  The instrument was then slowly withdrawn as the colon was fully examined. <<PROCEDUREIMAGES>>  FINDINGS:  A sessile polyp was found in the cecum. It was 2 mm in size. Polyp was snared without cautery. Retrieval was successful (see image1). snare polyp  This was otherwise a normal examination of the colon (see image2 and image4).   Retroflexed views in the rectum revealed no abnormalities.    The time to cecum =  1) 2.50 minutes. The scope was then withdrawn in  1) 9.50  minutes from the cecum and the procedure completed. COMPLICATIONS:  None ENDOSCOPIC IMPRESSION: 1) 2 mm sessile polyp in the cecum 2) Otherwise normal examination RECOMMENDATIONS: 1) If the polyp(s) removed today are proven to be adenomatous (pre-cancerous) polyps, you will need a repeat colonoscopy in 5 years. Otherwise you should continue to follow colorectal cancer screening guidelines for "routine risk" patients with colonoscopy in 10 years. You will receive a letter within 1-2 weeks with the results of your biopsy as well as final recommendations.  Please call my office if you have not received a letter after 3 weeks. REPEAT EXAM:   You will receive a letter from Dr. Arlyce Dice in 1-2 weeks, after reviewing the final pathology, with followup recommendations.  ______________________________ Barbette Hair Arlyce Dice, MD  CC:  Willow Ora, MD  n. Rosalie Doctor:   Barbette Hair. Jisela Merlino at 10/17/2011 02:22 PM  [place], [phone]

## 2011-10-17 NOTE — Patient Instructions (Signed)
YOU HAD AN ENDOSCOPIC PROCEDURE TODAY AT THE [facility]: Refer to the procedure report that was given to you for any specific questions about what was found during the examination.  If the procedure report does not answer your questions, please call your gastroenterologist to clarify.  If you requested that your care partner not be given the details of your procedure findings, then the procedure report has been included in a sealed envelope for you to review at your convenience later.  YOU SHOULD EXPECT: Some feelings of bloating in the abdomen. Passage of more gas than usual.  Walking can help get rid of the air that was put into your GI tract during the procedure and reduce the bloating. If you had a lower endoscopy (such as a colonoscopy or flexible sigmoidoscopy) you may notice spotting of blood in your stool or on the toilet paper. If you underwent a bowel prep for your procedure, then you may not have a normal bowel movement for a few days.  DIET: Your first meal following the procedure should be a light meal and then it is ok to progress to your normal diet.  A half-sandwich or bowl of soup is an example of a good first meal.  Heavy or fried foods are harder to digest and may make you feel nauseous or bloated.  Likewise meals heavy in dairy and vegetables can cause extra gas to form and this can also increase the bloating.  Drink plenty of fluids but you should avoid alcoholic beverages for 24 hours.  ACTIVITY: Your care partner should take you home directly after the procedure.  You should plan to take it easy, moving slowly for the rest of the day.  You can resume normal activity the day after the procedure however you should NOT DRIVE or use heavy machinery for 24 hours (because of the sedation medicines used during the test).    SYMPTOMS TO REPORT IMMEDIATELY: A gastroenterologist can be reached at any hour.  During normal business hours, 8:30 AM to 5:00 PM Monday through Friday,  call [phone].  After hours and on weekends, please call the GI answering service at [phone] who will take a message and have the physician on call contact you.   Following lower endoscopy (colonoscopy or flexible sigmoidoscopy):  Excessive amounts of blood in the stool  Significant tenderness or worsening of abdominal pains  Swelling of the abdomen that is new, acute  Fever of 100F or higher  Following upper endoscopy (EGD)  Vomiting of blood or coffee ground material  New chest pain or pain under the shoulder blades  Painful or persistently difficult swallowing  New shortness of breath  Fever of 100F or higher  Black, tarry-looking stools  FOLLOW UP: If any biopsies were taken you will be contacted by phone or by letter within the next 1-3 weeks.  Call your gastroenterologist if you have not heard about the biopsies in 3 weeks.  Our staff will call the home number listed on your records the next business day following your procedure to check on you and address any questions or concerns that you may have at that time regarding the information given to you following your procedure. This is a courtesy call and so if there is no answer at the home number and we have not heard from you through the emergency physician on call, we will assume that you have returned to your regular daily activities without incident.  SIGNATURES/CONFIDENTIALITY: You and/or your care   partner have signed paperwork which will be entered into your electronic medical record.  These signatures attest to the fact that that the information above on your After Visit Summary has been reviewed and is understood.  Full responsibility of the confidentiality of this discharge information lies with you and/or your care-partner.  

## 2011-10-18 ENCOUNTER — Telehealth: Payer: Self-pay | Admitting: *Deleted

## 2011-10-18 DIAGNOSIS — D649 Anemia, unspecified: Secondary | ICD-10-CM

## 2011-10-18 LAB — FECAL OCCULT BLOOD, IMMUNOCHEMICAL: Fecal Occult Bld: NEGATIVE

## 2011-10-18 NOTE — Telephone Encounter (Signed)
  Follow up Call-  Call back number 10/17/2011  Post procedure Call Back phone  # [phone]  Permission to leave phone message Yes     Patient questions:  Do you have a fever, pain , or abdominal swelling? no Pain Score  0 *  Have you tolerated food without any problems? yes  Have you been able to return to your normal activities? yes  Do you have any questions about your discharge instructions: Diet   no Medications  no Follow up visit  no  Do you have questions or concerns about your Care? no  Actions: * If pain score is 4 or above: No action needed, pain <4.

## 2011-10-18 NOTE — Progress Notes (Signed)
Quick Note:  Your hemeoccult cards testing for microscopic bleeding were negative. No further GI workup is required at this time.  Please inform patient of the results ______

## 2011-10-20 ENCOUNTER — Telehealth: Payer: Self-pay | Admitting: Gastroenterology

## 2011-10-20 ENCOUNTER — Encounter: Payer: Self-pay | Admitting: *Deleted

## 2011-10-20 NOTE — Telephone Encounter (Signed)
Called and left message for pt to call back.

## 2011-10-21 NOTE — Telephone Encounter (Signed)
Left message for pt to call back  °

## 2011-10-24 NOTE — Telephone Encounter (Signed)
Called and left message for pt to call back. After multiple attempts to reach pt, have been unable to get in touch with her.

## 2011-10-25 ENCOUNTER — Encounter: Payer: Self-pay | Admitting: Gastroenterology

## 2011-11-14 ENCOUNTER — Other Ambulatory Visit: Payer: Self-pay | Admitting: *Deleted

## 2011-11-14 MED ORDER — SYNTHROID 150 MCG PO TABS: 150.0000 ug | ORAL_TABLET | Freq: Every day | ORAL | Status: DC

## 2011-11-14 NOTE — Telephone Encounter (Signed)
Rx sent 

## 2011-12-02 ENCOUNTER — Ambulatory Visit (INDEPENDENT_AMBULATORY_CARE_PROVIDER_SITE_OTHER)
Admission: RE | Admit: 2011-12-02 | Discharge: 2011-12-02 | Disposition: A | Discharge status: Home / Self Care | Payer: BC Managed Care – PPO | Source: Ambulatory Visit | Attending: Internal Medicine | Admitting: Internal Medicine

## 2011-12-02 ENCOUNTER — Ambulatory Visit (INDEPENDENT_AMBULATORY_CARE_PROVIDER_SITE_OTHER): Payer: BC Managed Care – PPO | Admitting: Internal Medicine

## 2011-12-02 VITALS — BP 140/86 | HR 75 | Temp 97.7°F | Wt 268.0 lb

## 2011-12-02 DIAGNOSIS — M79673 Pain in unspecified foot: Secondary | ICD-10-CM

## 2011-12-02 DIAGNOSIS — M79609 Pain in unspecified limb: Secondary | ICD-10-CM

## 2011-12-02 DIAGNOSIS — I10 Essential (primary) hypertension: Secondary | ICD-10-CM

## 2011-12-02 DIAGNOSIS — E119 Type 2 diabetes mellitus without complications: Secondary | ICD-10-CM

## 2011-12-02 DIAGNOSIS — E039 Hypothyroidism, unspecified: Secondary | ICD-10-CM

## 2011-12-02 DIAGNOSIS — J45909 Unspecified asthma, uncomplicated: Secondary | ICD-10-CM

## 2011-12-02 DIAGNOSIS — D649 Anemia, unspecified: Secondary | ICD-10-CM

## 2011-12-02 LAB — BASIC METABOLIC PANEL
BUN: 18 mg/dL (ref 6–23)
CO2: 30 mEq/L (ref 19–32)
Calcium: 9.1 mg/dL (ref 8.4–10.5)
Creatinine, Ser: 0.7 mg/dL (ref 0.4–1.2)
Glucose, Bld: 95 mg/dL (ref 70–99)

## 2011-12-02 LAB — TSH: TSH: 1.38 u[IU]/mL (ref 0.35–5.50)

## 2011-12-02 MED ORDER — KETOCONAZOLE 2 % EX CREA: TOPICAL_CREAM | Freq: Two times a day (BID) | CUTANEOUS | Status: AC

## 2011-12-02 MED ORDER — AZELASTINE HCL 0.1 % NA SOLN: 2.0000 | Freq: Two times a day (BID) | NASAL | Status: DC

## 2011-12-02 MED ORDER — ALBUTEROL SULFATE HFA 108 (90 BASE) MCG/ACT IN AERS: 2.0000 | INHALATION_SPRAY | Freq: Four times a day (QID) | RESPIRATORY_TRACT | Status: DC

## 2011-12-02 NOTE — Assessment & Plan Note (Addendum)
Labs  Feet exam normal today, no neuropathy

## 2011-12-02 NOTE — Assessment & Plan Note (Signed)
EGD and colonoscopy 10/2011 showed gastritis and 2 polyps ; Next colonoscopy 5 years.

## 2011-12-02 NOTE — Progress Notes (Signed)
  Subjective:    Patient ID: Taylor Ramirez, female    DOB: 13-Aug-1955, 56 y.o.   MRN: [id number]  HPI Routine office visit Complains of left foot pain (distally) for a few days. No recent injury. Has a history of asthma and allergies,  good compliance with medications however in the last few days she has noticed increased cough and sinus congestion. She does have some clear nasal discharge and clear sputum production. Hypertension, good medication compliance, ambulatory BP is within normal per patient. Hypothyroidism, good medication compliance.  Past Medical History  Diagnosis Date  . Hypertension   . Asthma     Pulmonary function Tests 09-2006: mild asthma  . Hypothyroidism   . Anemia     h/o  . 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 Allergic rhinitis   . Heart murmur     ECHO 5/10 was neg  . Diabetes mellitus     able to control with diet and exercise   Past Surgical History  Procedure Date  . Tonsillectomy   . Abdominal hysterectomy 2011    no oophorectomy    Review of Systems Denies any fever or chills Admits to increase in sneezing, itchy eyes and itchy ears in the last few days. She does have occasional wheezing, has no albuterol. No chest pain or shortness of breath No nausea, vomiting, diarrhea.     Objective:   Physical Exam  General -- alert, well-developed, and overweight appearing. No apparent distress.  HEENT --  face symmetric,nose not congested Lungs -- normal respiratory effort, no intercostal retractions, no accessory muscle use, and normal breath sounds.   Heart-- normal rate, regular rhythm, no murmur, and no gallop.   Feet-- symmetric, distal left foot slightly tender to palpation but no deformities. Some maceration between the toes on the left foot DIABETIC FEET EXAM: No lower extremity edema Normal pedal pulses bilaterally Skin and nails are normal without calluses Pinprick examination of the feet  normal. Neurologic-- alert & oriented X3 and strength normal in all extremities. Psych-- Cognition and judgment appear intact. Alert and cooperative with normal attention span and concentration.  not anxious appearing and not depressed appearing.      Assessment & Plan:

## 2011-12-02 NOTE — Assessment & Plan Note (Addendum)
Distal left foot pain lately, she reports increase in physical activity at work, walking more. Incidentally, she has dermatophytosis. Plan:  X-ray, rule out a stress fracture. To use  shoes with good support  Ketoconazole for a fungal infection between the toes, see physical exam

## 2011-12-02 NOTE — Assessment & Plan Note (Signed)
Good medication compliance, strates  ambulatory blood pressures ok, see instructions

## 2011-12-02 NOTE — Assessment & Plan Note (Signed)
The patient reports some coughing lately occasional wheezing and allergy symptoms. She is alrready on allegra-singulair- Nasonex. Plan: Add albuterol as needed Add Astelin nose spray

## 2011-12-02 NOTE — Assessment & Plan Note (Signed)
Good med compliance, check a TSH 

## 2011-12-02 NOTE — Patient Instructions (Addendum)
Check the  blood pressure 2 or 3 times a week, be sure it is less than 135/85. If it is consistently higher, let me know ------- Please get your x-ray at the other [facility]  office located at: [address] [place], across from [facility].  Please go to the basement, this is a walk-in facility, they are open from 8:30 to 5:30 PM. Phone number [phone]. ------ For  cough, we are adding Astelin a nose spray and albuterol to be used only as needed if you are wheezing . If the cough continue, please let me know.

## 2011-12-05 ENCOUNTER — Other Ambulatory Visit (HOSPITAL_COMMUNITY): Payer: Self-pay | Admitting: Obstetrics and Gynecology

## 2011-12-05 ENCOUNTER — Encounter: Payer: Self-pay | Admitting: Internal Medicine

## 2011-12-05 DIAGNOSIS — Z1231 Encounter for screening mammogram for malignant neoplasm of breast: Secondary | ICD-10-CM

## 2011-12-06 ENCOUNTER — Encounter: Payer: Self-pay | Admitting: *Deleted

## 2011-12-28 ENCOUNTER — Ambulatory Visit (HOSPITAL_COMMUNITY): Payer: BC Managed Care – PPO

## 2012-01-14 ENCOUNTER — Encounter (HOSPITAL_COMMUNITY): Payer: Self-pay | Admitting: Emergency Medicine

## 2012-01-14 ENCOUNTER — Emergency Department (HOSPITAL_COMMUNITY): Payer: No Typology Code available for payment source

## 2012-01-14 ENCOUNTER — Emergency Department (HOSPITAL_COMMUNITY)
Admission: EM | Admit: 2012-01-14 | Discharge: 2012-01-14 | Disposition: A | Discharge status: Home / Self Care | Payer: No Typology Code available for payment source | Attending: Emergency Medicine | Admitting: Emergency Medicine

## 2012-01-14 DIAGNOSIS — Y92410 Unspecified street and highway as the place of occurrence of the external cause: Secondary | ICD-10-CM | POA: Insufficient documentation

## 2012-01-14 DIAGNOSIS — E119 Type 2 diabetes mellitus without complications: Secondary | ICD-10-CM | POA: Insufficient documentation

## 2012-01-14 DIAGNOSIS — M542 Cervicalgia: Secondary | ICD-10-CM

## 2012-01-14 DIAGNOSIS — R079 Chest pain, unspecified: Secondary | ICD-10-CM | POA: Insufficient documentation

## 2012-01-14 DIAGNOSIS — M25519 Pain in unspecified shoulder: Secondary | ICD-10-CM | POA: Insufficient documentation

## 2012-01-14 DIAGNOSIS — M5030 Other cervical disc degeneration, unspecified cervical region: Secondary | ICD-10-CM | POA: Insufficient documentation

## 2012-01-14 DIAGNOSIS — I10 Essential (primary) hypertension: Secondary | ICD-10-CM | POA: Insufficient documentation

## 2012-01-14 MED ORDER — HYDROCODONE-ACETAMINOPHEN 5-325 MG PO TABS: 2.0000 | ORAL_TABLET | ORAL | Status: DC | PRN

## 2012-01-14 MED ORDER — PREDNISONE 20 MG PO TABS: 20.0000 mg | ORAL_TABLET | Freq: Two times a day (BID) | ORAL | Status: DC
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
## 2012-01-14 NOTE — Discharge Instructions (Signed)
Use heat on the sore area 3 or 4 times a day.   Cervical Sprain A cervical sprain is an injury in the neck in which the ligaments are stretched or torn. The ligaments are the tissues that hold the bones of the neck (vertebrae) in place.Cervical sprains can range from very mild to very severe. Most cervical sprains get better in 1 to 3 weeks, but it depends on the cause and extent of the injury. Severe cervical sprains can cause the neck vertebrae to be unstable. This can lead to damage of the spinal cord and can result in serious nervous system problems. Your caregiver will determine whether your cervical sprain is mild or severe. CAUSES  Severe cervical sprains may be caused by:  Contact sport injuries (football, rugby, wrestling, hockey, auto racing, gymnastics, diving, martial arts, boxing).   Motor vehicle collisions.   Whiplash injuries. This means the neck is forcefully whipped backward and forward.   Falls.  Mild cervical sprains may be caused by:   Awkward positions, such as cradling a telephone between your ear and shoulder.   Sitting in a chair that does not offer proper support.   Working at a poorly Marketing executive station.   Activities that require looking up or down for long periods of time.  SYMPTOMS   Pain, soreness, stiffness, or a burning sensation in the front, back, or sides of the neck. This discomfort may develop immediately after injury or it may develop slowly and not begin for 24 hours or more after an injury.   Pain or tenderness directly in the middle of the back of the neck.   Shoulder or upper back pain.   Limited ability to move the neck.   Headache.   Dizziness.   Weakness, numbness, or tingling in the hands or arms.   Muscle spasms.   Difficulty swallowing or chewing.   Tenderness and swelling of the neck.  DIAGNOSIS  Most of the time, your caregiver can diagnose this problem by taking your history and doing a physical exam. Your  caregiver will ask about any known problems, such as arthritis in the neck or a previous neck injury. X-rays may be taken to find out if there are any other problems, such as problems with the bones of the neck. However, an X-ray often does not reveal the full extent of a cervical sprain. Other tests such as a computed tomography (CT) scan or magnetic resonance imaging (MRI) may be needed. TREATMENT  Treatment depends on the severity of the cervical sprain. Mild sprains can be treated with rest, keeping the neck in place (immobilization), and pain medicines. Severe cervical sprains need immediate immobilization and an appointment with an orthopedist or neurosurgeon. Several treatment options are available to help with pain, muscle spasms, and other symptoms. Your caregiver may prescribe:  Medicines, such as pain relievers, numbing medicines, or muscle relaxants.   Physical therapy. This can include stretching exercises, strengthening exercises, and posture training. Exercises and improved posture can help stabilize the neck, strengthen muscles, and help stop symptoms from returning.   A neck collar to be worn for short periods of time. Often, these collars are worn for comfort. However, certain collars may be worn to protect the neck and prevent further worsening of a serious cervical sprain.  HOME CARE INSTRUCTIONS   Put ice on the injured area.   Put ice in a plastic bag.   Place a towel between your skin and the bag.   Leave the ice  on for 15 to 20 minutes, 3 to 4 times a day.   Only take over-the-counter or prescription medicines for pain, discomfort, or fever as directed by your caregiver.   Keep all follow-up appointments as directed by your caregiver.   Keep all physical therapy appointments as directed by your caregiver.   If a neck collar is prescribed, wear it as directed by your caregiver.   Do not drive while wearing a neck collar.   Make any needed adjustments to your work  station to promote good posture.   Avoid positions and activities that make your symptoms worse.   Warm up and stretch before being active to help prevent problems.  SEEK MEDICAL CARE IF:   Your pain is not controlled with medicine.   You are unable to decrease your pain medicine over time as planned.   Your activity level is not improving as expected.  SEEK IMMEDIATE MEDICAL CARE IF:   You develop any bleeding, stomach upset, or signs of an allergic reaction to your medicine.   Your symptoms get worse.   You develop new, unexplained symptoms.   You have numbness, tingling, weakness, or paralysis in any part of your body.  MAKE SURE YOU:   Understand these instructions.   Will watch your condition.   Will get help right away if you are not doing well or get worse.  Document Released: 05/22/2007 Document Revised: 07/14/2011 Document Reviewed: 04/27/2011 [facility] Patient Information 2012 [place], [place].Degenerative Disc Disease Degenerative disc disease is a condition caused by the changes that occur in the cushions of the backbone (spinal discs) as you grow older. Spinal discs are soft and compressible discs located between the bones of the spine (vertebrae). They act like shock absorbers. Degenerative disc disease can affect the wholespine. However, the neck and lower back are most commonly affected. Many changes can occur in the spinal discs with aging, such as:  The spinal discs may dry and shrink.   Small tears may occur in the tough, outer covering of the disc (annulus).   The disc space may become smaller due to loss of water.   Abnormal growths in the bone (spurs) may occur. This can put pressure on the nerve roots exiting the spinal canal, causing pain.   The spinal canal may become narrowed.  CAUSES  Degenerative disc disease is a condition caused by the changes that occur in the spinal discs with aging. The exact cause is not known, but there is a genetic basis for  many patients. Degenerative changes can occur due to loss of fluid in the disc. This makes the disc thinner and reduces the space between the backbones. Small cracks can develop in the outer layer of the disc. This can lead to the breakdown of the disc. You are more likely to get degenerative disc disease if you are overweight. Smoking cigarettes and doing heavy work such as weightlifting can also increase your risk of this condition. Degenerative changes can start after a sudden injury. Growth of bone spurs can compress the nerve roots and cause pain.  SYMPTOMS  The symptoms vary from person to person. Some people may have no pain, while others have severe pain. The pain may be so severe that it can limit your activities. The location of the pain depends on the part of your backbone that is affected. You will have neck or arm pain if a disc in the neck area is affected. You will have pain in your back, buttocks, or  legs if a disc in the lower back is affected. The pain becomes worse while bending, reaching up, or with twisting movements. The pain may start gradually and then get worse as time passes. It may also start after a major or minor injury. You may feel numbness or tingling in the arms or legs.  DIAGNOSIS  Your caregiver will ask you about your symptoms and about activities or habits that may cause the pain. He or she may also ask about any injuries, diseases, ortreatments you have had earlier. Your caregiver will examine you to check for the range of movement that is possible in the affected area, to check for strength in your extremities, and to check for sensation in the areas of the arms and legs supplied by different nerve roots. An X-ray of the spine may be taken. Your caregiver may suggest other imaging tests, such as a computerized magnetic scan (MRI), if needed.  TREATMENT  Treatment includes rest, modifying your activities, and applying ice and heat. Your caregiver may prescribe medicines to  reduce your pain and may ask you to do some exercises to strengthen your back. In some cases, you may need surgery. You and your caregiver will decide on the treatment that is best for you. HOME CARE INSTRUCTIONS   Follow proper lifting and walking techniques as advised by your caregiver.   Maintain good posture.   Exercise regularly as advised.   Perform relaxation exercises.   Change your sitting, standing, and sleeping habits as advised. Change positions frequently.   Lose weight as advised.   Stop smoking if you smoke.   Wear supportive footwear.  SEEK MEDICAL CARE IF:  The pain does not go away within 1 to 4 weeks. SEEK IMMEDIATE MEDICAL CARE IF:   The pain is severe.   You notice weakness in your arms, hands, or legs.   You begin to lose control of your bladder or bowel.  MAKE SURE YOU:   Understand these instructions.   Will watch your condition.   Will get help right away if you are not doing well or get worse.  Document Released: 05/22/2007 Document Revised: 07/14/2011 Document Reviewed: 05/22/2007 [facility] Patient Information 2012 [place], [place].

## 2012-01-16 ENCOUNTER — Other Ambulatory Visit: Payer: Self-pay | Admitting: Internal Medicine

## 2012-01-16 MED ORDER — SYNTHROID 150 MCG PO TABS: 150.0000 ug | ORAL_TABLET | Freq: Every day | ORAL | Status: DC

## 2012-01-16 NOTE — Telephone Encounter (Signed)
Refill done.  

## 2012-01-16 NOTE — Telephone Encounter (Signed)
Patient called and stated she contacted [facility] on Friday morning for a refill, called them this morning and they told her to call us as they have not received anything back from [place]. I do not have any faxes here from Friday, sat., or Sunday making a request for this patient.  Please send refill for Syn[facility] to [facility] on [address], last wrt 4.8.13, instr. Take 1 tablet (150 mcg total) by mouth daily Last ov 04.26.13

## 2012-01-17 ENCOUNTER — Ambulatory Visit (INDEPENDENT_AMBULATORY_CARE_PROVIDER_SITE_OTHER): Payer: BC Managed Care – PPO | Admitting: Internal Medicine

## 2012-01-17 ENCOUNTER — Ambulatory Visit (INDEPENDENT_AMBULATORY_CARE_PROVIDER_SITE_OTHER)
Admission: RE | Admit: 2012-01-17 | Discharge: 2012-01-17 | Disposition: A | Discharge status: Home / Self Care | Payer: BC Managed Care – PPO | Source: Ambulatory Visit | Attending: Internal Medicine | Admitting: Internal Medicine

## 2012-01-17 ENCOUNTER — Encounter: Payer: Self-pay | Admitting: *Deleted

## 2012-01-17 VITALS — BP 140/88 | HR 85 | Temp 98.4°F | Wt 266.0 lb

## 2012-01-17 DIAGNOSIS — IMO0002 Reserved for concepts with insufficient information to code with codable children: Secondary | ICD-10-CM

## 2012-01-17 MED ORDER — MELOXICAM 15 MG PO TABS: 15.0000 mg | ORAL_TABLET | Freq: Every day | ORAL | Status: AC | PRN

## 2012-01-17 MED ORDER — CYCLOBENZAPRINE HCL 10 MG PO TABS: 10.0000 mg | ORAL_TABLET | Freq: Two times a day (BID) | ORAL | Status: AC | PRN
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
## 2012-01-17 NOTE — Patient Instructions (Signed)
meloxicam (motrin like medicine) once a day, take with food, watch for stomach side effects like nausea , stomach pain, change in the color of stools, if that is the case, stop taking the medicine and let me know. Flexeril , muscle relaxant as needed for pain, will cause drowsiness  Hydrocodone only if the pain persist Call if no better in 2 weeks or if severe ----------------------- Please get your x-ray at the other [facility]  office located at: [address] [place], across from [facility].  Please go to the basement, this is a walk-in facility, they are open from 8:30 to 5:30 PM. Phone number [phone].

## 2012-01-19 ENCOUNTER — Encounter: Payer: Self-pay | Admitting: Internal Medicine

## 2012-01-20 ENCOUNTER — Telehealth: Payer: Self-pay | Admitting: Internal Medicine

## 2012-01-20 MED ORDER — HYDROCODONE-ACETAMINOPHEN 5-325 MG PO TABS: 2.0000 | ORAL_TABLET | ORAL | Status: AC | PRN

## 2012-01-20 NOTE — Telephone Encounter (Signed)
Discussed with pt, sent in a refill of hydrocodone.

## 2012-01-20 NOTE — Telephone Encounter (Signed)
Does she need to be seen today. Please advise.

## 2012-01-20 NOTE — Telephone Encounter (Signed)
We can extend her work excuse 4 additional days.  If she has new symptoms she needs to be seen. If the pain persist, needs to call us next week for a referral

## 2012-01-20 NOTE — Telephone Encounter (Signed)
Patient called and stated she is not any better & was unable to return to work today. Patient stated she needed to talk to dr. About this. Please call at [phone]

## 2012-01-23 ENCOUNTER — Telehealth: Payer: Self-pay | Admitting: *Deleted

## 2012-01-23 ENCOUNTER — Encounter: Payer: Self-pay | Admitting: *Deleted

## 2012-01-23 DIAGNOSIS — IMO0002 Reserved for concepts with insufficient information to code with codable children: Secondary | ICD-10-CM

## 2012-01-23 NOTE — Telephone Encounter (Signed)
Pt called back after i discussed her x-ray results with her & she stated that she would like to be referred to a specialist. Please advise.

## 2012-01-23 NOTE — Telephone Encounter (Signed)
[facility] Triage Call Report Triage Record Num: [id number] Operator: Durward Mallard DiMatteis Patient Name: Taylor Ramirez Call Date & Time: 01/21/2012 8:44:45AM Patient Phone: [phone] PCP: Nolon Rod. Paz Patient Gender: Female PCP Fax : Patient DOB: 1955/11/12 Practice Name: [facility] Reason for Call: Caller: [name]; PCP: Willow Ora; CB#: [phone]; Call regarding Medication; pt wants to know if she has t opick up a Rx or if Hydrocodone can be called in; she called office on 01/20/12 for a refill and office staff said that she needed to pick up Rx; explained that this is a controlled substance nd she has to pick up hard copy at office; voices understanding; denies any needed further assistance; she states that she will call office on Monday Protocol(s) Used: Office Note Recommended Outcome per Protocol: Information Noted and Sent to Office Reason for Outcome: Caller information to office Care Advice: ~ 01/21/2012 8:48:31AM Page 1 of 1 [id number]

## 2012-01-24 ENCOUNTER — Other Ambulatory Visit: Payer: Self-pay | Admitting: Internal Medicine

## 2012-01-24 MED ORDER — VALSARTAN-HYDROCHLOROTHIAZIDE 80-12.5 MG PO TABS: 1.0000 | ORAL_TABLET | Freq: Every day | ORAL | Status: DC

## 2012-01-24 NOTE — Telephone Encounter (Signed)
Pt returned your call.  

## 2012-01-24 NOTE — Telephone Encounter (Signed)
Left message to call office

## 2012-01-24 NOTE — Telephone Encounter (Signed)
Refer to ortho, dx back sprain

## 2012-01-24 NOTE — Telephone Encounter (Signed)
Discuss with patient  

## 2012-01-25 ENCOUNTER — Other Ambulatory Visit: Payer: Self-pay | Admitting: Internal Medicine

## 2012-01-25 NOTE — Telephone Encounter (Signed)
Pharmacy confirmed pt still has refills left on file.  

## 2012-02-07 ENCOUNTER — Encounter: Payer: Self-pay | Admitting: Internal Medicine

## 2012-02-15 ENCOUNTER — Telehealth: Payer: Self-pay | Admitting: Internal Medicine

## 2012-02-15 NOTE — Telephone Encounter (Signed)
In reference to Orthopaedic referral entered on 01/24/12, [facility] attempted several times to contact patient with no response.  I have also left multiple messages for patient to return my call, and also mailed her a letter.  Patient will not respond.

## 2012-02-15 NOTE — Telephone Encounter (Signed)
Noted, no further action

## 2012-03-06 DIAGNOSIS — Z0279 Encounter for issue of other medical certificate: Secondary | ICD-10-CM

## 2012-04-02 ENCOUNTER — Ambulatory Visit: Payer: BC Managed Care – PPO | Admitting: Internal Medicine

## 2012-04-24 ENCOUNTER — Ambulatory Visit: Payer: BC Managed Care – PPO | Admitting: Internal Medicine

## 2012-04-24 DIAGNOSIS — Z0289 Encounter for other administrative examinations: Secondary | ICD-10-CM

## 2012-05-07 ENCOUNTER — Other Ambulatory Visit: Payer: Self-pay | Admitting: Internal Medicine

## 2012-05-07 NOTE — Telephone Encounter (Signed)
Refill done.  

## 2012-07-13 ENCOUNTER — Other Ambulatory Visit: Payer: Self-pay | Admitting: Internal Medicine

## 2012-07-13 NOTE — Telephone Encounter (Signed)
Refill done.  

## 2012-08-17 ENCOUNTER — Encounter: Payer: Self-pay | Admitting: *Deleted

## 2012-08-17 ENCOUNTER — Other Ambulatory Visit: Payer: Self-pay | Admitting: Internal Medicine

## 2012-08-17 NOTE — Telephone Encounter (Signed)
Pt is overdue for a physical, mailed letter to notify pt she needs to call office & schedule appt.  Refill done.

## 2012-09-19 ENCOUNTER — Telehealth: Payer: Self-pay | Admitting: Internal Medicine

## 2012-09-19 MED ORDER — SYNTHROID 150 MCG PO TABS: ORAL_TABLET | ORAL | Status: DC

## 2012-09-19 NOTE — Telephone Encounter (Signed)
pt called stated she called [facility] earlier this wk to get refill on synthroid. I do not show we received anything from this pharmacy. Pt would like to get this medicine this morning before the weather gets bad. Please note I have added new Pharmacy ([facility] on [address]) pt would like a call back after sent so she can go pick up today  Cb# [phone]

## 2012-09-19 NOTE — Telephone Encounter (Signed)
Refill done. Pt made aware.  

## 2013-02-21 ENCOUNTER — Other Ambulatory Visit: Payer: Self-pay | Admitting: Internal Medicine

## 2013-02-21 NOTE — Telephone Encounter (Signed)
Ok to refill? Pt. Last OV in June 11, of 2013.  Last filled 1.10.14 for #90 with no refills. Pt. Was mailed letter on 1.10.14 reminding to schedule appt for yearly physical. No appointment arranged. Please advise.

## 2013-02-21 NOTE — Telephone Encounter (Signed)
Okay to refill a 21 day supply. Call pt: Not further RF without visit, schedule a ROV within 3 weeks. Is unable to communicate with the patient, send a letter clearly stating "Not further RF without visit"

## 2013-02-22 ENCOUNTER — Encounter: Payer: Self-pay | Admitting: *Deleted

## 2013-02-22 NOTE — Telephone Encounter (Signed)
Refill done per orders. lmovm to return call to office as well as mailed letter to patient

## 2013-02-25 NOTE — Telephone Encounter (Signed)
Pt returned your call. CB# [phone]

## 2013-02-27 NOTE — Telephone Encounter (Signed)
Letter already mailed to patient as well as noted on rx to schedule OV

## 2013-03-01 ENCOUNTER — Ambulatory Visit (INDEPENDENT_AMBULATORY_CARE_PROVIDER_SITE_OTHER): Payer: BC Managed Care – PPO | Admitting: Internal Medicine

## 2013-03-01 VITALS — BP 160/70 | HR 103 | Wt 286.2 lb

## 2013-03-01 DIAGNOSIS — I10 Essential (primary) hypertension: Secondary | ICD-10-CM

## 2013-03-01 DIAGNOSIS — J45909 Unspecified asthma, uncomplicated: Secondary | ICD-10-CM

## 2013-03-01 DIAGNOSIS — E119 Type 2 diabetes mellitus without complications: Secondary | ICD-10-CM

## 2013-03-01 DIAGNOSIS — R0609 Other forms of dyspnea: Secondary | ICD-10-CM | POA: Insufficient documentation

## 2013-03-01 DIAGNOSIS — E039 Hypothyroidism, unspecified: Secondary | ICD-10-CM

## 2013-03-01 LAB — CBC WITH DIFFERENTIAL/PLATELET
Basophils Absolute: 0.1 10*3/uL (ref 0.0–0.1)
Basophils Relative: 1 % (ref 0–1)
Hemoglobin: 12.4 g/dL (ref 12.0–15.0)
Lymphocytes Relative: 27 % (ref 12–46)
MCHC: 32.8 g/dL (ref 30.0–36.0)
Neutro Abs: 6.8 10*3/uL (ref 1.7–7.7)
Neutrophils Relative %: 61 % (ref 43–77)
RDW: 15.7 % — ABNORMAL HIGH (ref 11.5–15.5)
WBC: 11.1 10*3/uL — ABNORMAL HIGH (ref 4.0–10.5)

## 2013-03-01 MED ORDER — MONTELUKAST SODIUM 10 MG PO TABS: 10.0000 mg | ORAL_TABLET | Freq: Every day | ORAL | Status: DC

## 2013-03-01 MED ORDER — BUDESONIDE-FORMOTEROL FUMARATE 80-4.5 MCG/ACT IN AERO: 2.0000 | INHALATION_SPRAY | Freq: Every day | RESPIRATORY_TRACT | Status: DC | PRN

## 2013-03-01 MED ORDER — POTASSIUM CHLORIDE CRYS ER 20 MEQ PO TBCR: 20.0000 meq | EXTENDED_RELEASE_TABLET | Freq: Every day | ORAL | Status: DC

## 2013-03-01 MED ORDER — ALBUTEROL SULFATE HFA 108 (90 BASE) MCG/ACT IN AERS: 2.0000 | INHALATION_SPRAY | Freq: Four times a day (QID) | RESPIRATORY_TRACT | Status: DC | PRN

## 2013-03-01 MED ORDER — SYNTHROID 150 MCG PO TABS: ORAL_TABLET | ORAL | Status: DC

## 2013-03-01 MED ORDER — VALSARTAN-HYDROCHLOROTHIAZIDE 160-12.5 MG PO TABS: 1.0000 | ORAL_TABLET | Freq: Every day | ORAL | Status: DC

## 2013-03-01 NOTE — Progress Notes (Signed)
  Subjective:    Patient ID: Taylor Ramirez, female    DOB: [birth year], 56 y.o.   MRN: [id number]  HPI Acute visit, last visit 01-2012 Chief concern today is dyspnea on exertion. For the last month has noticed DOE after 2 blocks of walking, previously she was able to walk at libitum. Symptoms are not associated with chest pain, palpitations or cough. She is taking her BP medications, ambulatory BPs range from normal to elevated. She's not taking asthma medications--->  off singular, albuterol, rarely takes Symbicort. Good compliance with Synthroid.  Past Medical History  Diagnosis Date  . Hypertension   . Asthma     Pulmonary function Tests 09-2006: mild asthma  . Hypothyroidism   . Anemia     h/o  . 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 Allergic rhinitis   . Heart murmur     ECHO 5/10 was neg  . Diabetes mellitus     able to control with diet and exercise   Past Surgical History  Procedure Laterality Date  . Tonsillectomy    . Abdominal hysterectomy  2011    no oophorectomy   History   Social History  . Marital Status: Married    Spouse Name: N/A    Number of Children: 1  . Years of Education: N/A   Occupational History  . [employer]    Social History Main Topics  . Smoking status: Never Smoker   . Smokeless tobacco: Never Used  . Alcohol Use: Yes     Comment: socially  . Drug Use: No  . Sexually Active: Not on file   Other Topics Concern  . Not on file   Social History Narrative  . No narrative on file     Review of Systems Has gained 20 pounds since the last time she was here about a year ago, reports she is eating "very little". Denies lower extremity edema, sleeps with 3 pillows (orthopnea?).  No nausea, vomiting, diarrhea or blood in the stools. No heartburn Admits to cough wheezing at night (but not during exertion.)    Objective:   Physical Exam BP 160/70  Pulse 103  Wt 286 lb 3.2 oz (129.819 kg)  BMI 50.71 kg/m2   SpO2 94%  General -- alert, well-developed, BMI 50 .   Neck --no thyromegaly , no JVD at 45 HEENT --Not pale Lungs -- normal respiratory effort, no intercostal retractions, no accessory muscle use, and normal breath sounds.   Heart-- normal rate, regular rhythm, no murmur, and no gallop.   Abdomen--soft, non-tender, no distention  Extremities-- no pretibial edema bilaterally  Neurologic-- alert & oriented X3 and strength normal in all extremities. Psych-- Cognition and judgment appear intact. Alert and cooperative with normal attention span and concentration.  not anxious appearing and not depressed appearing.       Assessment & Plan:

## 2013-03-01 NOTE — Assessment & Plan Note (Addendum)
Has nocturnal cough, no heartburn. Restart Symbicort, Singulair and use albuterol when necessary. Consider empiric PPIs

## 2013-03-01 NOTE — Assessment & Plan Note (Signed)
Gain 20 pounds in 1 year Labs Will discuss diet-exercise further on RTC

## 2013-03-01 NOTE — Assessment & Plan Note (Signed)
Labs

## 2013-03-01 NOTE — Patient Instructions (Addendum)
Singulair daily symbicort twice a day Albuterol as needed --- Get the XR at [facility], corner of [address] (10 minutes form here); they are open 24/7 [address]  [place], [place] [address] [phone] -- ER if chest pain, difficulty breathing gets worse --- Next visit in 2 -3 weeks

## 2013-03-01 NOTE — Assessment & Plan Note (Addendum)
Well controlled? Increase diovan HCT from 80-12.5 to 160-12.5 In the past potassium has been in the low side, continue with current potassium supplementation. Labs

## 2013-03-01 NOTE — Assessment & Plan Note (Signed)
57 year old lady with history of diabetes, hypertension, asthma and hypothyroidism presents with dyspnea on exertion after 2 blocks of walking. no volume overload on clinical grounds. EKG today-- no ischemic changes  Differential diagnosis is large but includes deconditioning, obesity,  untreated asthma, underlying CAD, etc. Plan: Labs. Chest x-ray. Restart asthma treatment Reassess in 2 - 3 weeks If symptoms persist, consider cardiology eval ER if symptoms severe

## 2013-03-02 LAB — TSH: TSH: 1.482 u[IU]/mL (ref 0.350–4.500)

## 2013-03-02 LAB — COMPREHENSIVE METABOLIC PANEL
ALT: 23 U/L (ref 0–35)
CO2: 27 mEq/L (ref 19–32)
Calcium: 9.5 mg/dL (ref 8.4–10.5)
Chloride: 102 mEq/L (ref 96–112)
Creat: 0.7 mg/dL (ref 0.50–1.10)
Glucose, Bld: 92 mg/dL (ref 70–99)
Total Bilirubin: 0.4 mg/dL (ref 0.3–1.2)

## 2013-03-03 ENCOUNTER — Encounter: Payer: Self-pay | Admitting: Internal Medicine

## 2013-03-07 ENCOUNTER — Telehealth: Payer: Self-pay | Admitting: Internal Medicine

## 2013-03-07 NOTE — Telephone Encounter (Signed)
Discussed lab results with patient, verbalized understanding.

## 2013-03-07 NOTE — Telephone Encounter (Signed)
Patient is returning a missed call from yesterday. Unsure what call was regarding.

## 2013-03-18 ENCOUNTER — Ambulatory Visit: Payer: BC Managed Care – PPO | Admitting: Internal Medicine

## 2013-03-19 ENCOUNTER — Ambulatory Visit: Payer: BC Managed Care – PPO | Admitting: Internal Medicine

## 2013-03-26 ENCOUNTER — Ambulatory Visit: Payer: BC Managed Care – PPO | Admitting: Internal Medicine

## 2013-04-12 ENCOUNTER — Ambulatory Visit: Payer: BC Managed Care – PPO | Admitting: Internal Medicine

## 2013-04-12 DIAGNOSIS — Z0289 Encounter for other administrative examinations: Secondary | ICD-10-CM

## 2013-07-06 ENCOUNTER — Other Ambulatory Visit: Payer: Self-pay | Admitting: Internal Medicine

## 2013-07-08 NOTE — Telephone Encounter (Signed)
Valsartan-HCTZ refilled per protocol

## 2013-10-09 ENCOUNTER — Telehealth: Payer: Self-pay | Admitting: Internal Medicine

## 2013-10-09 ENCOUNTER — Encounter: Payer: Self-pay | Admitting: Internal Medicine

## 2013-10-09 ENCOUNTER — Ambulatory Visit (INDEPENDENT_AMBULATORY_CARE_PROVIDER_SITE_OTHER): Payer: BC Managed Care – PPO | Admitting: Internal Medicine

## 2013-10-09 VITALS — BP 145/81 | HR 90 | Temp 98.4°F | Wt 286.0 lb

## 2013-10-09 DIAGNOSIS — J45909 Unspecified asthma, uncomplicated: Secondary | ICD-10-CM

## 2013-10-09 DIAGNOSIS — E119 Type 2 diabetes mellitus without complications: Secondary | ICD-10-CM

## 2013-10-09 DIAGNOSIS — R0989 Other specified symptoms and signs involving the circulatory and respiratory systems: Secondary | ICD-10-CM

## 2013-10-09 DIAGNOSIS — R0609 Other forms of dyspnea: Secondary | ICD-10-CM

## 2013-10-09 DIAGNOSIS — I10 Essential (primary) hypertension: Secondary | ICD-10-CM

## 2013-10-09 DIAGNOSIS — E039 Hypothyroidism, unspecified: Secondary | ICD-10-CM

## 2013-10-09 MED ORDER — AZITHROMYCIN 250 MG PO TABS
ORAL_TABLET | ORAL | Status: DC
Start: 2013-10-09 — End: 2014-01-10

## 2013-10-09 MED ORDER — MONTELUKAST SODIUM 10 MG PO TABS: 10.0000 mg | ORAL_TABLET | Freq: Every day | ORAL | Status: DC

## 2013-10-09 NOTE — Assessment & Plan Note (Signed)
Reports good medication compliance, check a TSH

## 2013-10-09 NOTE — Assessment & Plan Note (Signed)
See previous entry from 02-2013, labs were okay, chest x-ray not done, good compliance with asthma medication. Dyspnea on exertion better but not completely gone. Plan: Chest x-ray, stress test given multiple cardiovascular risk factors.

## 2013-10-09 NOTE — Telephone Encounter (Signed)
Relevant patient education assigned to patient using Emmi. ° °

## 2013-10-09 NOTE — Patient Instructions (Signed)
Rest, fluids , tylenol For cough, take Mucinex DM twice a day as needed   Take the antibiotic as prescribed  (zpack) Call if no better in few days Call anytime if the symptoms are severe Continue hearing he does, Symbicort twice a day albuterol as needed.  Please schedule labs: TSH hypothyroidism A1c, FLP diabetes CMP hypertension  Please get your x-ray at the other [facility]  office located at: [place], across from [facility]  Please go to the basement, this is a walk-in facility, they are open from 8:30 to 5:30 PM. Phone number [phone].  Check the  blood pressure 2 or 3 times a   week be sure it is between 110/60 and 140/85. Ideal blood pressure is 120/80. If it is consistently higher or lower, let me know   Next Visit in 3 months , fasting

## 2013-10-09 NOTE — Assessment & Plan Note (Addendum)
Last A1c very good, recheck an A1c and a FLP

## 2013-10-09 NOTE — Progress Notes (Signed)
Subjective:    Patient ID: Taylor Ramirez, female    DOB: 01/27/1956, 58 y.o.   MRN: [id number]  DOS:  10/09/2013 Type of  visit: ROV, last visit 02-2013 Dyspnea on exertion--see previous visit, had dyspnea on exertion, labs were okay, chest x-ray not done, we restarted asthma medication and DOE is better but not completely well, previously was able to walk 2 blocks before stopping, now she is able to walk 3 blocks (despite the fact resp sx improved). Asthma--in general better, hardly ever uses albuterol except for the last week when she developed a number of symptoms: Cough, scratchy throat, "chest congestion". Hypertension- good medication compliance, reports ambulatory BPs are "good" but could not tell me any specific  readings. Diabetes--on diet control, reports were CBGs are "good" but no actual readings available.    ROS Denies chest pain, no lower extremity edema  No nausea or vomiting. Mild subjective fever last week along with mild chills.   Past Medical History  Diagnosis Date  . Hypertension   . Asthma     Pulmonary function Tests 09-2006: mild asthma  . Hypothyroidism   . Anemia     h/o  . 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 Allergic rhinitis   . Heart murmur     ECHO 5/10 was neg  . Diabetes mellitus     able to control with diet and exercise    Past Surgical History  Procedure Laterality Date  . Tonsillectomy    . Abdominal hysterectomy  2011    no oophorectomy    History   Social History  . Marital Status: Married    Spouse Name: N/A    Number of Children: 1  . Years of Education: N/A   Occupational History  . [employer]    Social History Main Topics  . Smoking status: Never Smoker   . Smokeless tobacco: Never Used  . Alcohol Use: Yes     Comment: socially  . Drug Use: No  . Sexual Activity: Not on file   Other Topics Concern  . Not on file   Social History Narrative  . No narrative on file          Medication List       This list is accurate as of: 10/09/13 11:59 PM.  Always use your most recent med list.               albuterol 108 (90 BASE) MCG/ACT inhaler  Commonly known as:  PROVENTIL HFA;VENTOLIN HFA  Inhale 2 puffs into the lungs every 6 (six) hours as needed. For asthma     aspirin 81 MG tablet  Take 81 mg by mouth daily.     azelastine 137 MCG/SPRAY nasal spray  Commonly known as:  ASTELIN  Place 2 sprays into the nose 2 (two) times daily. Use in each nostril as directed     azithromycin 250 MG tablet  Commonly known as:  ZITHROMAX Z-PAK  As directed     budesonide-formoterol 80-4.5 MCG/ACT inhaler  Commonly known as:  SYMBICORT  Inhale 2 puffs into the lungs daily as needed. At bedtime for wheezing     estrogens (conjugated) 0.3 MG tablet  Commonly known as:  PREMARIN  Take 0.3 mg by mouth daily.     fexofenadine 180 MG tablet  Commonly known as:  ALLEGRA  Take 180 mg by mouth daily as needed. For allergies     fluconazole 150  MG tablet  Commonly known as:  DIFLUCAN     hydrocortisone 2.5 % cream     hydrocortisone valerate cream 0.2 %  Commonly known as:  WESTCORT     ketoconazole 2 % cream  Commonly known as:  NIZORAL     montelukast 10 MG tablet  Commonly known as:  SINGULAIR  Take 1 tablet (10 mg total) by mouth at bedtime.     potassium chloride SA 20 MEQ tablet  Commonly known as:  K-DUR,KLOR-CON  Take 1 tablet (20 mEq total) by mouth daily.     SYNTHROID 150 MCG tablet  Generic drug:  levothyroxine  TAKE 1 TABLET (150 MCG TOTAL) BY MOUTH DAILY.     valsartan-hydrochlorothiazide 160-12.5 MG per tablet  Commonly known as:  DIOVAN-HCT  take 1 tablet once daily           Objective:   Physical Exam BP 145/81  Pulse 90  Temp(Src) 98.4 F (36.9 C)  Wt 286 lb (129.729 kg)  SpO2 95% General -- alert, well-developed, NAD.  HEENT-- Not pale. TMs normal, throat symmetric, no redness or discharge. Face symmetric, sinuses not tender  to palpation. Nose slt ongested. Lungs--  no intercostal retractions, no accessory muscle use, + ronchi and end exp wheezes w/ cough, no rales.  Heart-- normal rate, regular rhythm, no murmur.  Extremities-- no pretibial edema bilaterally  Neurologic--  alert & oriented X3. Speech normal, gait normal, strength normal in all extremities.  Psych-- Cognition and judgment appear intact. Cooperative with normal attention span and concentration. No anxious or depressed appearing.      Assessment & Plan:   I asked the patient to come back as recommended, last time I saw her was 02-2013, did not keep a followup appointment

## 2013-10-09 NOTE — Assessment & Plan Note (Signed)
Patient reports good compliance with Symbicort, rarely uses albuterol except for the last few days, she has intercurrent bronchitis. Plan: Continue with Symbicort, albuterol and treat bronchitis, see instructions.

## 2013-10-09 NOTE — Assessment & Plan Note (Signed)
BP today 145/81, slightly elevated, unclear levels of ambulatory BPs: Plan: No change, labs

## 2013-10-09 NOTE — Progress Notes (Signed)
Pre visit review using our clinic review tool, if applicable. No additional management support is needed unless otherwise documented below in the visit note. 

## 2013-10-10 ENCOUNTER — Other Ambulatory Visit: Payer: BC Managed Care – PPO

## 2013-10-11 ENCOUNTER — Telehealth: Payer: Self-pay

## 2013-10-11 NOTE — Telephone Encounter (Signed)
Relevant patient education mailed to patient.  

## 2013-10-14 ENCOUNTER — Other Ambulatory Visit: Payer: BC Managed Care – PPO

## 2013-10-14 ENCOUNTER — Ambulatory Visit: Payer: BC Managed Care – PPO | Admitting: Internal Medicine

## 2013-10-29 ENCOUNTER — Encounter (HOSPITAL_COMMUNITY): Payer: BC Managed Care – PPO

## 2013-11-13 ENCOUNTER — Encounter (HOSPITAL_COMMUNITY): Payer: BC Managed Care – PPO

## 2013-11-21 ENCOUNTER — Encounter (HOSPITAL_COMMUNITY): Payer: BC Managed Care – PPO

## 2013-12-27 ENCOUNTER — Other Ambulatory Visit: Payer: Self-pay | Admitting: Internal Medicine

## 2014-01-10 ENCOUNTER — Ambulatory Visit (INDEPENDENT_AMBULATORY_CARE_PROVIDER_SITE_OTHER): Payer: BC Managed Care – PPO | Admitting: Internal Medicine

## 2014-01-10 VITALS — BP 135/84 | HR 89 | Temp 97.8°F | Wt 282.0 lb

## 2014-01-10 DIAGNOSIS — J45909 Unspecified asthma, uncomplicated: Secondary | ICD-10-CM

## 2014-01-10 DIAGNOSIS — E039 Hypothyroidism, unspecified: Secondary | ICD-10-CM

## 2014-01-10 DIAGNOSIS — R0609 Other forms of dyspnea: Secondary | ICD-10-CM

## 2014-01-10 DIAGNOSIS — I10 Essential (primary) hypertension: Secondary | ICD-10-CM

## 2014-01-10 DIAGNOSIS — R0989 Other specified symptoms and signs involving the circulatory and respiratory systems: Secondary | ICD-10-CM

## 2014-01-10 DIAGNOSIS — IMO0002 Reserved for concepts with insufficient information to code with codable children: Secondary | ICD-10-CM

## 2014-01-10 DIAGNOSIS — E119 Type 2 diabetes mellitus without complications: Secondary | ICD-10-CM

## 2014-01-10 LAB — COMPREHENSIVE METABOLIC PANEL
ALT: 26 U/L (ref 0–35)
AST: 22 U/L (ref 0–37)
Albumin: 4.1 g/dL (ref 3.5–5.2)
Alkaline Phosphatase: 70 U/L (ref 39–117)
BILIRUBIN TOTAL: 0.3 mg/dL (ref 0.2–1.2)
BUN: 15 mg/dL (ref 6–23)
CALCIUM: 9.7 mg/dL (ref 8.4–10.5)
CO2: 28 meq/L (ref 19–32)
CREATININE: 0.6 mg/dL (ref 0.4–1.2)
Chloride: 100 mEq/L (ref 96–112)
GFR: 130.12 mL/min (ref >60.00)
GLUCOSE: 102 mg/dL — AB (ref 70–99)
Potassium: 3.7 mEq/L (ref 3.5–5.1)
Sodium: 139 mEq/L (ref 135–145)
Total Protein: 8.2 g/dL (ref 6.0–8.3)

## 2014-01-10 LAB — LIPID PANEL
CHOL/HDL RATIO: 3
Cholesterol: 181 mg/dL (ref 0–200)
HDL: 55.6 mg/dL (ref >39.00)
LDL Cholesterol: 112 mg/dL — ABNORMAL HIGH (ref 0–99)
NONHDL: 125.4
Triglycerides: 68 mg/dL (ref 0.0–149.0)
VLDL: 13.6 mg/dL (ref 0.0–40.0)

## 2014-01-10 LAB — CBC WITH DIFFERENTIAL/PLATELET
Basophils Absolute: 0.1 10*3/uL (ref 0.0–0.1)
Basophils Relative: 0.4 % (ref 0.0–3.0)
EOS PCT: 4.4 % (ref 0.0–5.0)
Eosinophils Absolute: 0.5 10*3/uL (ref 0.0–0.7)
HEMATOCRIT: 38.6 % (ref 36.0–46.0)
HEMOGLOBIN: 12.7 g/dL (ref 12.0–15.0)
LYMPHS ABS: 3 10*3/uL (ref 0.7–4.0)
LYMPHS PCT: 26.4 % (ref 12.0–46.0)
MCHC: 32.8 g/dL (ref 30.0–36.0)
MCV: 89.2 fl (ref 78.0–100.0)
MONOS PCT: 4.6 % (ref 3.0–12.0)
Monocytes Absolute: 0.5 10*3/uL (ref 0.1–1.0)
NEUTROS ABS: 7.2 10*3/uL (ref 1.4–7.7)
Neutrophils Relative %: 64.2 % (ref 43.0–77.0)
Platelets: 314 10*3/uL (ref 150.0–400.0)
RBC: 4.32 Mil/uL (ref 3.87–5.11)
RDW: 15.9 % — ABNORMAL HIGH (ref 11.5–15.5)
WBC: 11.2 10*3/uL — AB (ref 4.0–10.5)

## 2014-01-10 LAB — TSH: TSH: 2.5 u[IU]/mL (ref 0.35–4.50)

## 2014-01-10 LAB — HEMOGLOBIN A1C: HEMOGLOBIN A1C: 6.2 % (ref 4.6–6.5)

## 2014-01-10 NOTE — Assessment & Plan Note (Signed)
Chart and pertinent labs reviewed  Due for a CMP, BP seems to be well-controlled

## 2014-01-10 NOTE — Assessment & Plan Note (Signed)
So far has not pursue a chest x-ray or stress test, recommend to proceed at her convenience. Symptoms at baseline

## 2014-01-10 NOTE — Progress Notes (Signed)
Subjective:    Patient ID: Taylor Ramirez, female    DOB: 24-Dec-1955, 58 y.o.   MRN: [id number]  DOS:  01/10/2014 Type of  Visit: routine History:  Back pain for around 2 months, at the lower back, occasionally it radiates to" both legs" unable to describe the pain any further. Occasionally takes Aleve without much help. High blood pressure, good medication compliance, ambulatory BPs "okay". BP today is very good Diabetes, never had A1c checked as recommended DOE-- At baseline or  "maybe better". So far did not pursue the chest x-ray or stress test.    ROS Denies chest pain, lower extremity edema or palpitations. Occasionally has cough and wheezing in the morning,  good compliance with asthma medications, reaching for albuterol approximately once a week. Denies any bladder or bowel incontinence or lower extremity paresthesias  Past Medical History  Diagnosis Date  . Hypertension   . Asthma     Pulmonary function Tests 09-2006: mild asthma  . Hypothyroidism   . Anemia     h/o  . 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 Allergic rhinitis   . Heart murmur     ECHO 5/10 was neg  . Diabetes mellitus     able to control with diet and exercise    Past Surgical History  Procedure Laterality Date  . Tonsillectomy    . Abdominal hysterectomy  2011    no oophorectomy    History   Social History  . Marital Status: Married    Spouse Name: N/A    Number of Children: 1  . Years of Education: N/A   Occupational History  . [employer]    Social History Main Topics  . Smoking status: Never Smoker   . Smokeless tobacco: Never Used  . Alcohol Use: Yes     Comment: socially  . Drug Use: No  . Sexual Activity: Not on file   Other Topics Concern  . Not on file   Social History Narrative  . No narrative on file        Medication List       This list is accurate as of: 01/10/14 11:59 PM.  Always use your most recent med list.               albuterol 108 (90 BASE) MCG/ACT inhaler  Commonly known as:  PROVENTIL HFA;VENTOLIN HFA  Inhale 2 puffs into the lungs every 6 (six) hours as needed. For asthma     aspirin 81 MG tablet  Take 81 mg by mouth daily.     azelastine 0.1 % nasal spray  Commonly known as:  ASTELIN  Place 2 sprays into the nose 2 (two) times daily. Use in each nostril as directed     budesonide-formoterol 80-4.5 MCG/ACT inhaler  Commonly known as:  SYMBICORT  Inhale 2 puffs into the lungs daily as needed. At bedtime for wheezing     estrogens (conjugated) 0.3 MG tablet  Commonly known as:  PREMARIN  Take 0.3 mg by mouth daily.     fexofenadine 180 MG tablet  Commonly known as:  ALLEGRA  Take 180 mg by mouth daily as needed. For allergies     fluconazole 150 MG tablet  Commonly known as:  DIFLUCAN     hydrocortisone 2.5 % cream     hydrocortisone valerate cream 0.2 %  Commonly known as:  WESTCORT     ketoconazole 2 % cream  Commonly known  as:  NIZORAL     montelukast 10 MG tablet  Commonly known as:  SINGULAIR  Take 1 tablet (10 mg total) by mouth at bedtime.     potassium chloride SA 20 MEQ tablet  Commonly known as:  K-DUR,KLOR-CON  Take 1 tablet (20 mEq total) by mouth daily.     SYNTHROID 150 MCG tablet  Generic drug:  levothyroxine  TAKE 1 TABLET (150 MCG TOTAL) BY MOUTH DAILY.     valsartan-hydrochlorothiazide 160-12.5 MG per tablet  Commonly known as:  DIOVAN-HCT  take 1 tablet once daily           Objective:   Physical Exam BP 135/84  Pulse 89  Temp(Src) 97.8 F (36.6 C)  Wt 282 lb (127.914 kg)  SpO2 94% General -- alert, well-developed, NAD.   Lungs -- normal respiratory effort, no intercostal retractions, no accessory muscle use, and normal breath sounds.  Heart-- normal rate, regular rhythm, no murmur.  Back-- slt TTP at lower spine  DIABETIC FEET EXAM: No lower extremity edema Normal pedal pulses bilaterally Skin normal, nails normal, mild calluses at plantar  area  Pinprick examination of the feet normal. Neurologic--  alert & oriented X3. Speech normal, gait appropriate for age, strength symmetric and appropriate for age.  DTRs symmetric. No antalgic posture  Psych-- Cognition and judgment appear intact. Cooperative with normal attention span and concentration. No anxious or depressed appearing.        Assessment & Plan:

## 2014-01-10 NOTE — Patient Instructions (Signed)
Get your blood work before you leave   Will refer you to a back specialist  Talk with the front desk or [place] about rescheduling the chest XR and the stress test  Next visit is for a physical exam in 4 months ,  fasting Please make an appointment

## 2014-01-10 NOTE — Assessment & Plan Note (Signed)
Chart and pertinent labs reviewed  Due for TSH

## 2014-01-10 NOTE — Assessment & Plan Note (Signed)
Chart and pertinent labs reviewed  Feet exam negative Due for a A1c and FLP

## 2014-01-10 NOTE — Assessment & Plan Note (Signed)
Today presents with a two-month history of back pain, she is a slightly tender at the lower back but otherwise neurological exam is normal. Declined conservative treatment and prefers to be referred -------------> will be arranged.

## 2014-01-10 NOTE — Progress Notes (Signed)
Pre visit review using our clinic review tool, if applicable. No additional management support is needed unless otherwise documented below in the visit note. 

## 2014-01-10 NOTE — Assessment & Plan Note (Signed)
Seems to be well-controlled, using  albuterol on average once a week

## 2014-01-13 ENCOUNTER — Encounter: Payer: Self-pay | Admitting: *Deleted

## 2014-03-01 ENCOUNTER — Other Ambulatory Visit: Payer: Self-pay | Admitting: Internal Medicine

## 2014-05-15 ENCOUNTER — Encounter (HOSPITAL_COMMUNITY): Payer: Self-pay | Admitting: Emergency Medicine

## 2014-05-15 ENCOUNTER — Emergency Department (HOSPITAL_COMMUNITY): Payer: BC Managed Care – PPO

## 2014-05-15 ENCOUNTER — Ambulatory Visit (HOSPITAL_COMMUNITY)
Admission: RE | Admit: 2014-05-15 | Discharge: 2014-05-15 | Disposition: A | Discharge status: Home / Self Care | Payer: BC Managed Care – PPO | Attending: Psychiatry | Admitting: Psychiatry

## 2014-05-15 ENCOUNTER — Emergency Department (HOSPITAL_COMMUNITY)
Admission: EM | Admit: 2014-05-15 | Discharge: 2014-05-17 | Disposition: A | Discharge status: Other Acute Inpatient Hospital | Payer: BC Managed Care – PPO | Attending: Emergency Medicine | Admitting: Emergency Medicine

## 2014-05-15 DIAGNOSIS — Z7982 Long term (current) use of aspirin: Secondary | ICD-10-CM | POA: Diagnosis not present

## 2014-05-15 DIAGNOSIS — E039 Hypothyroidism, unspecified: Secondary | ICD-10-CM | POA: Diagnosis not present

## 2014-05-15 DIAGNOSIS — F23 Brief psychotic disorder: Secondary | ICD-10-CM | POA: Diagnosis not present

## 2014-05-15 DIAGNOSIS — I10 Essential (primary) hypertension: Secondary | ICD-10-CM | POA: Insufficient documentation

## 2014-05-15 DIAGNOSIS — E119 Type 2 diabetes mellitus without complications: Secondary | ICD-10-CM | POA: Diagnosis not present

## 2014-05-15 DIAGNOSIS — R011 Cardiac murmur, unspecified: Secondary | ICD-10-CM | POA: Insufficient documentation

## 2014-05-15 DIAGNOSIS — J45909 Unspecified asthma, uncomplicated: Secondary | ICD-10-CM | POA: Diagnosis not present

## 2014-05-15 DIAGNOSIS — Z79899 Other long term (current) drug therapy: Secondary | ICD-10-CM | POA: Diagnosis not present

## 2014-05-15 DIAGNOSIS — Z862 Personal history of diseases of the blood and blood-forming organs and certain disorders involving the immune mechanism: Secondary | ICD-10-CM | POA: Insufficient documentation

## 2014-05-15 DIAGNOSIS — R462 Strange and inexplicable behavior: Secondary | ICD-10-CM | POA: Diagnosis not present

## 2014-05-15 DIAGNOSIS — E8940 Asymptomatic postprocedural ovarian failure: Secondary | ICD-10-CM | POA: Diagnosis not present

## 2014-05-15 DIAGNOSIS — R4182 Altered mental status, unspecified: Secondary | ICD-10-CM | POA: Diagnosis present

## 2014-05-15 DIAGNOSIS — F209 Schizophrenia, unspecified: Secondary | ICD-10-CM | POA: Diagnosis present

## 2014-05-15 LAB — BASIC METABOLIC PANEL
ANION GAP: 14 (ref 5–15)
BUN: 11 mg/dL (ref 6–23)
CALCIUM: 9.7 mg/dL (ref 8.4–10.5)
CO2: 27 meq/L (ref 19–32)
CREATININE: 0.63 mg/dL (ref 0.50–1.10)
Chloride: 98 mEq/L (ref 96–112)
GFR calc Af Amer: >90 mL/min (ref >90)
GFR calc non Af Amer: >90 mL/min (ref >90)
Glucose, Bld: 122 mg/dL — ABNORMAL HIGH (ref 70–99)
Potassium: 3.5 mEq/L — ABNORMAL LOW (ref 3.7–5.3)
Sodium: 139 mEq/L (ref 137–147)

## 2014-05-15 LAB — URINALYSIS, ROUTINE W REFLEX MICROSCOPIC
BILIRUBIN URINE: NEGATIVE
GLUCOSE, UA: NEGATIVE mg/dL
HGB URINE DIPSTICK: NEGATIVE
Ketones, ur: NEGATIVE mg/dL
Leukocytes, UA: NEGATIVE
NITRITE: NEGATIVE
PH: 6.5 (ref 5.0–8.0)
Protein, ur: NEGATIVE mg/dL
SPECIFIC GRAVITY, URINE: 1.016 (ref 1.005–1.030)
Urobilinogen, UA: 0.2 mg/dL (ref 0.0–1.0)

## 2014-05-15 LAB — CBC
HCT: 39.4 % (ref 36.0–46.0)
HEMOGLOBIN: 13.1 g/dL (ref 12.0–15.0)
MCH: 29 pg (ref 26.0–34.0)
MCHC: 33.2 g/dL (ref 30.0–36.0)
MCV: 87.4 fL (ref 78.0–100.0)
Platelets: 335 10*3/uL (ref 150–400)
RBC: 4.51 MIL/uL (ref 3.87–5.11)
RDW: 15 % (ref 11.5–15.5)
WBC: 15.6 10*3/uL — ABNORMAL HIGH (ref 4.0–10.5)

## 2014-05-15 LAB — I-STAT CG4 LACTIC ACID, ED: Lactic Acid, Venous: 0.84 mmol/L (ref 0.5–2.2)

## 2014-05-15 LAB — CBG MONITORING, ED: Glucose-Capillary: 139 mg/dL — ABNORMAL HIGH (ref 70–99)

## 2014-05-15 MED ORDER — LORAZEPAM 2 MG/ML IJ SOLN
2.0000 mg | Freq: Once | INTRAMUSCULAR | Status: DC
  Filled 2014-05-15: qty 1

## 2014-05-15 MED ORDER — ONDANSETRON HCL 4 MG PO TABS: 4.0000 mg | ORAL_TABLET | Freq: Three times a day (TID) | ORAL | Status: DC | PRN

## 2014-05-15 MED ORDER — VALSARTAN-HYDROCHLOROTHIAZIDE 160-12.5 MG PO TABS: 1.0000 | ORAL_TABLET | Freq: Every day | ORAL | Status: DC

## 2014-05-15 MED ORDER — ASPIRIN 81 MG PO CHEW
81.0000 mg | CHEWABLE_TABLET | Freq: Every day | ORAL | Status: DC
  Administered 2014-05-15 – 2014-05-17 (×3): 81 mg via ORAL
  Filled 2014-05-15 (×3): qty 1

## 2014-05-15 MED ORDER — IRBESARTAN 150 MG PO TABS
150.0000 mg | ORAL_TABLET | Freq: Every day | ORAL | Status: DC
  Administered 2014-05-15 – 2014-05-17 (×3): 150 mg via ORAL
  Filled 2014-05-15 (×3): qty 1

## 2014-05-15 MED ORDER — ACETAMINOPHEN 325 MG PO TABS: 650.0000 mg | ORAL_TABLET | ORAL | Status: DC | PRN

## 2014-05-15 MED ORDER — LEVOTHYROXINE SODIUM 150 MCG PO TABS
150.0000 ug | ORAL_TABLET | Freq: Every day | ORAL | Status: DC
  Administered 2014-05-15 – 2014-05-17 (×3): 150 ug via ORAL
  Filled 2014-05-15 (×4): qty 1

## 2014-05-15 MED ORDER — HYDROCHLOROTHIAZIDE 12.5 MG PO CAPS
12.5000 mg | ORAL_CAPSULE | Freq: Every day | ORAL | Status: DC
  Administered 2014-05-15 – 2014-05-17 (×3): 12.5 mg via ORAL
  Filled 2014-05-15 (×3): qty 1

## 2014-05-15 MED ORDER — ASPIRIN 81 MG PO TABS: 81.0000 mg | ORAL_TABLET | Freq: Every day | ORAL | Status: DC

## 2014-05-15 MED ORDER — STERILE WATER FOR INJECTION IJ SOLN
INTRAMUSCULAR | Status: AC
  Administered 2014-05-15
  Filled 2014-05-15: qty 10

## 2014-05-15 MED ORDER — ZIPRASIDONE MESYLATE 20 MG IM SOLR
INTRAMUSCULAR | Status: AC
  Administered 2014-05-15: 20 mg
  Filled 2014-05-15: qty 20

## 2014-05-15 MED ORDER — ZIPRASIDONE MESYLATE 20 MG IM SOLR: 20.0000 mg | Freq: Once | INTRAMUSCULAR | Status: AC

## 2014-05-15 MED ORDER — ESTROGENS CONJUGATED 0.3 MG PO TABS
0.3000 mg | ORAL_TABLET | Freq: Every day | ORAL | Status: DC
  Administered 2014-05-15 – 2014-05-17 (×3): 0.3 mg via ORAL
  Filled 2014-05-15 (×3): qty 1

## 2014-05-15 NOTE — ED Notes (Signed)
Pt became increasingly verbally aggressive toward staff. Pt is very confused and attempting to leave ED. Pt is only alert to sefl only. MD called and security at bedside. Several verbal deescaltion attempts tried w/o any results. MD stated to give pt 20 mg geodon im.

## 2014-05-15 NOTE — ED Notes (Signed)
Pt has been altered x 5 days.  Sent by [facility].  Pt was to be a medical clearance.  Pt has no psych history and takes no psych meds.  Pt at first cannot tell her name.  She is saying random words like "Code Blue" Code 1.  What happened to me.  She can tell president but not year.  No facial droop  Pt gets confused on basic instructions.  Cannot eye follow finger.  Does recognize numbers.

## 2014-05-15 NOTE — ED Notes (Signed)
MD at bedside.  EDP KOHUT PRESENT TO EVALUATE THIS PT

## 2014-05-15 NOTE — ED Notes (Signed)
Pt belongings in locker #29. 
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
## 2014-05-15 NOTE — ED Notes (Signed)
Pt received in rm 29. Alert and oriented to person only. Pt inappropriately answers questions. Pt's response is "code 1' with right pointer finger up in the air whenever she says these words. Husband remains at bedside at the moment and providing information about onset of pt's condition.

## 2014-05-15 NOTE — ED Notes (Signed)
Patient transported to CT 
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
## 2014-05-16 ENCOUNTER — Encounter (HOSPITAL_COMMUNITY): Payer: Self-pay | Admitting: Psychiatry

## 2014-05-16 DIAGNOSIS — F29 Unspecified psychosis not due to a substance or known physiological condition: Secondary | ICD-10-CM

## 2014-05-16 DIAGNOSIS — F209 Schizophrenia, unspecified: Secondary | ICD-10-CM | POA: Diagnosis present

## 2014-05-16 HISTORY — DX: Unspecified psychosis not due to a substance or known physiological condition: F29

## 2014-05-16 LAB — RAPID URINE DRUG SCREEN, HOSP PERFORMED
AMPHETAMINES: NOT DETECTED
Barbiturates: NOT DETECTED
Benzodiazepines: NOT DETECTED
Cocaine: NOT DETECTED
OPIATES: NOT DETECTED
Tetrahydrocannabinol: NOT DETECTED

## 2014-05-16 MED ORDER — LORAZEPAM 2 MG/ML IJ SOLN
2.0000 mg | Freq: Once | INTRAMUSCULAR | Status: AC
  Administered 2014-05-16: 2 mg via INTRAMUSCULAR

## 2014-05-16 MED ORDER — ZIPRASIDONE MESYLATE 20 MG IM SOLR
INTRAMUSCULAR | Status: AC
  Administered 2014-05-16: 20 mg via INTRAMUSCULAR
  Filled 2014-05-16: qty 20

## 2014-05-16 MED ORDER — RISPERIDONE 0.5 MG PO TABS
0.5000 mg | ORAL_TABLET | Freq: Two times a day (BID) | ORAL | Status: DC
  Administered 2014-05-16: 0.5 mg via ORAL
  Filled 2014-05-16 (×2): qty 1

## 2014-05-16 MED ORDER — ZIPRASIDONE MESYLATE 20 MG IM SOLR
20.0000 mg | Freq: Once | INTRAMUSCULAR | Status: AC
  Administered 2014-05-16: 20 mg via INTRAMUSCULAR

## 2014-05-16 MED ORDER — LORAZEPAM 2 MG/ML IJ SOLN
INTRAMUSCULAR | Status: AC
  Administered 2014-05-16: 2 mg via INTRAMUSCULAR
  Filled 2014-05-16: qty 1

## 2014-05-16 NOTE — Consult Note (Addendum)
[facility] Face-to-Face Psychiatry Consult   Reason for Consult:  Psychosis Referring Physician:  EDP  Taylor Ramirez is an 58 y.o. female. Total Time spent with patient: 20 minutes  Assessment: AXIS I:  Psychotic Disorder NOS AXIS II:  Deferred AXIS III:   Past Medical History  Diagnosis Date  . Hypertension   . Asthma     Pulmonary function Tests 09-2006: mild asthma  . Hypothyroidism   . Anemia     h/o  . 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 Allergic rhinitis   . Heart murmur     ECHO 5/10 was neg  . Diabetes mellitus     able to control with diet and exercise   AXIS IV:  other psychosocial or environmental problems, problems related to social environment and problems with primary support group AXIS V:  21-30 behavior considerably influenced by delusions or hallucinations OR serious impairment in judgment, communication OR inability to function in almost all areas  Plan:  Recommend psychiatric Inpatient admission when medically cleared.  Dr.Crucita Lacorte assessed the patient and concurs with the plan.  Subjective:   Taylor Ramirez is a 58 y.o. female patient admitted with psychosis.  HPI:  The patient presents with confusion and AMS.  She is oriented to self but thinks "Clair Gulling is beside her and she is at home."  Taylor Ramirez does remember answering the same questions earlier with TTS.  Disorganized and memory is poor.  A neighbor reports that she is using drugs from another neighbor but her UDS is negative. HPI Elements:   Location:  generalized a. Quality:  acute. Severity:  severe. Timing:  constant. Duration:  few days. Context:  stressors.  Past Psychiatric History: Past Medical History  Diagnosis Date  . Hypertension   . Asthma     Pulmonary function Tests 09-2006: mild asthma  . Hypothyroidism   . Anemia     h/o  . 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 Allergic rhinitis   . Heart murmur     ECHO 5/10 was neg   . Diabetes mellitus     able to control with diet and exercise    reports that she has never smoked. She has never used smokeless tobacco. She reports that she drinks alcohol. She reports that she does not use illicit drugs. Family History  Problem Relation Age of Onset  . Hypertension Father   . Hypothyroidism Mother   . Graves' disease Daughter   . Diabetes Sister   . Heart attack Neg Hx   . Colon cancer Neg Hx   . Breast cancer Neg Hx            Allergies:  No Known Allergies  ACT Assessment Complete:  Yes:    Educational Status    Risk to Self: Risk to self with the past 6 months Is patient at risk for suicide?: No Substance abuse history and/or treatment for substance abuse?: No  Risk to Others:    Abuse:    Prior Inpatient Therapy:    Prior Outpatient Therapy:    Additional Information:                    Objective: Blood pressure 123/82, pulse 99, temperature 98.1 F (36.7 C), temperature source Oral, resp. rate 17, SpO2 98.00%.There is no weight on file to calculate BMI. Results for orders placed during the hospital encounter of 05/15/14 (from the  past 72 hour(s))  CBC     Status: Abnormal   Collection Time    05/15/14  2:18 PM      Result Value Ref Range   WBC 15.6 (*) 4.0 - 10.5 K/uL   RBC 4.51  3.87 - 5.11 MIL/uL   Hemoglobin 13.1  12.0 - 15.0 g/dL   HCT 39.4  36.0 - 46.0 %   MCV 87.4  78.0 - 100.0 fL   MCH 29.0  26.0 - 34.0 pg   MCHC 33.2  30.0 - 36.0 g/dL   RDW 15.0  11.5 - 15.5 %   Platelets 335  150 - 400 K/uL  BASIC METABOLIC PANEL     Status: Abnormal   Collection Time    05/15/14  2:18 PM      Result Value Ref Range   Sodium 139  137 - 147 mEq/L   Potassium 3.5 (*) 3.7 - 5.3 mEq/L   Chloride 98  96 - 112 mEq/L   CO2 27  19 - 32 mEq/L   Glucose, Bld 122 (*) 70 - 99 mg/dL   BUN 11  6 - 23 mg/dL   Creatinine, Ser 0.63  0.50 - 1.10 mg/dL   Calcium 9.7  8.4 - 10.5 mg/dL   GFR calc non Af Amer >90  >90 mL/min   GFR calc Af Amer  >90  >90 mL/min   Comment: (NOTE)     The eGFR has been calculated using the CKD EPI equation.     This calculation has not been validated in all clinical situations.     eGFR's persistently <90 mL/min signify possible Chronic Kidney     Disease.   Anion gap 14  5 - 15  URINALYSIS, ROUTINE W REFLEX MICROSCOPIC     Status: None   Collection Time    05/15/14  3:50 PM      Result Value Ref Range   Color, Urine YELLOW  YELLOW   APPearance CLEAR  CLEAR   Specific Gravity, Urine 1.016  1.005 - 1.030   pH 6.5  5.0 - 8.0   Glucose, UA NEGATIVE  NEGATIVE mg/dL   Hgb urine dipstick NEGATIVE  NEGATIVE   Bilirubin Urine NEGATIVE  NEGATIVE   Ketones, ur NEGATIVE  NEGATIVE mg/dL   Protein, ur NEGATIVE  NEGATIVE mg/dL   Urobilinogen, UA 0.2  0.0 - 1.0 mg/dL   Nitrite NEGATIVE  NEGATIVE   Leukocytes, UA NEGATIVE  NEGATIVE   Comment: MICROSCOPIC NOT DONE ON URINES WITH NEGATIVE PROTEIN, BLOOD, LEUKOCYTES, NITRITE, OR GLUCOSE <1000 mg/dL.  I-STAT CG4 LACTIC ACID, ED     Status: None   Collection Time    05/15/14  4:08 PM      Result Value Ref Range   Lactic Acid, Venous 0.84  0.5 - 2.2 mmol/L  CBG MONITORING, ED     Status: Abnormal   Collection Time    05/15/14  9:57 PM      Result Value Ref Range   Glucose-Capillary 139 (*) 70 - 99 mg/dL  URINE RAPID DRUG SCREEN (HOSP PERFORMED)     Status: None   Collection Time    05/16/14 12:34 PM      Result Value Ref Range   Opiates NONE DETECTED  NONE DETECTED   Cocaine NONE DETECTED  NONE DETECTED   Benzodiazepines NONE DETECTED  NONE DETECTED   Amphetamines NONE DETECTED  NONE DETECTED   Tetrahydrocannabinol NONE DETECTED  NONE DETECTED   Barbiturates NONE DETECTED  NONE  DETECTED   Comment:            DRUG SCREEN FOR MEDICAL PURPOSES     ONLY.  IF CONFIRMATION IS NEEDED     FOR ANY PURPOSE, NOTIFY LAB     WITHIN 5 DAYS.                LOWEST DETECTABLE LIMITS     FOR URINE DRUG SCREEN     Drug Class       Cutoff (ng/mL)     Amphetamine       1000     Barbiturate      200     Benzodiazepine   200     Tricyclics       300     Opiates          300     Cocaine          300     THC              50   Labs are reviewed and are pertinent for no medical issues noted.  Current Facility-Administered Medications  Medication Dose Route Frequency Provider Last Rate Last Dose  . acetaminophen (TYLENOL) tablet 650 mg  650 mg Oral Q4H PRN Raeford Razor, MD      . aspirin chewable tablet 81 mg  81 mg Oral Daily Raeford Razor, MD   81 mg at 05/16/14 0929  . estrogens (conjugated) (PREMARIN) tablet 0.3 mg  0.3 mg Oral Daily Raeford Razor, MD   0.3 mg at 05/16/14 0929  . irbesartan (AVAPRO) tablet 150 mg  150 mg Oral Daily Raeford Razor, MD   150 mg at 05/16/14 5810   And  . hydrochlorothiazide (MICROZIDE) capsule 12.5 mg  12.5 mg Oral Daily Raeford Razor, MD   12.5 mg at 05/16/14 1330  . levothyroxine (SYNTHROID, LEVOTHROID) tablet 150 mcg  150 mcg Oral QAC breakfast Raeford Razor, MD   150 mcg at 05/16/14 0929  . LORazepam (ATIVAN) injection 2 mg  2 mg Intramuscular Once Raeford Razor, MD      . ondansetron [facility]) tablet 4 mg  4 mg Oral Q8H PRN Raeford Razor, MD      . risperiDONE (RISPERDAL) tablet 0.5 mg  0.5 mg Oral BID Nanine Means, NP       Current Outpatient Prescriptions  Medication Sig Dispense Refill  . aspirin 81 MG tablet Take 81 mg by mouth daily.        . diazepam (VALIUM) 10 MG tablet Take 10 mg by mouth every 6 (six) hours as needed for anxiety (anxiety & insomnia).      Marland Kitchen estrogens, conjugated, (PREMARIN) 0.3 MG tablet Take 0.3 mg by mouth daily.       Marland Kitchen levothyroxine (SYNTHROID, LEVOTHROID) 150 MCG tablet Take 150 mcg by mouth daily before breakfast.      . valsartan-hydrochlorothiazide (DIOVAN-HCT) 160-12.5 MG per tablet Take 1 tablet by mouth daily.        Psychiatric Specialty Exam:     Blood pressure 123/82, pulse 99, temperature 98.1 F (36.7 C), temperature source Oral, resp. rate 17, SpO2 98.00%.There is no  weight on file to calculate BMI.  General Appearance: Casual  Eye Contact::  Good  Speech:  Normal Rate  Volume:  Normal  Mood:  Irritable  Affect:  Congruent  Thought Process:  Disorganized  Orientation:  Full (Time, Place, and Person)  Thought Content:  Delusions and Hallucinations: Visual  Suicidal Thoughts:  No  Homicidal Thoughts:  No  Memory:  Immediate;   Fair Recent;   Poor Remote;   Poor  Judgement:  Impaired  Insight:  Lacking  Psychomotor Activity:  Decreased  Concentration:  Fair  Recall:  Poor  Fund of Knowledge:Fair  Language: Fair  Akathisia:  No  Handed:  Right  AIMS (if indicated):     Assets:  Housing Leisure Time Physical Health Resilience  Sleep:      Musculoskeletal: Strength & Muscle Tone: within normal limits Gait & Station: normal Patient leans: N/A  Treatment Plan Summary: Daily contact with patient to assess and evaluate symptoms and progress in treatment Medication management; Risperdal 0.5 mg BID for irritability, psychosis  Waylan Boga, PMH-NP 05/16/2014 5:51 PM  Patient seen, evaluated and I agree with notes by Nurse Practitioner. Corena Pilgrim, MD

## 2014-05-16 NOTE — ED Notes (Signed)
Psych MD/NP at bedside evaluating pt.

## 2014-05-16 NOTE — BH Assessment (Signed)
[place] Assessment Progress Note  At 14:33 I received a call from [place] at [facility].  Pt's referral to their facility is currently under review.  Jalene Mullet, MA Triage Specialist 05/16/2014 @ 14:35

## 2014-05-16 NOTE — Progress Notes (Signed)
Pt referred to [facility], declined due to being to independent for geri psych and recommended an acute hall due to hallucinations.  Pt referred to [facility] pending review.    Noreene Larsson [phone]  ED CSW 05/16/2014 1334pm

## 2014-05-16 NOTE — BH Assessment (Signed)
Referral faxed to [facility] per charge nurse request.

## 2014-05-16 NOTE — BH Assessment (Signed)
Dr. Dan Europe recommends placement at geriatric-psych facility. Contacted the following facility for placement:  INFORMATION RECEIVED AND UNDER REVIEW: [facility], per [place], FAXED CLINICAL INFORMATION: [facility]: [facility], per [facility], per [facility] CAPACITY: Ellin Mayhew, per [facility], per [facility], per [facility], per [place] RESPONSE: [place], [place], [facility] Triage Specialist [phone]

## 2014-05-16 NOTE — ED Notes (Signed)
Pt's husband Ravneet Spilker [phone].

## 2014-05-16 NOTE — BH Assessment (Deleted)
D/C per Dr. Darleene Cleaver and Waylan Boga, NP. Writer met with patient prior to discharge and provided follow up referrals to substance abuse programs. Patient requesting services with [facility]. Writer specifically pointed out the contact information to [employer].

## 2014-05-17 ENCOUNTER — Encounter (HOSPITAL_COMMUNITY): Payer: Self-pay | Admitting: Psychiatry

## 2014-05-17 NOTE — ED Notes (Signed)
Pt is alert and oriented x 2. Is able to recall phone's number and did make a call to her husband. She reports feeling a whole better after waking up this AM. Will cont to care for pt.

## 2014-05-17 NOTE — ED Notes (Signed)
Returned call received from [place] at [facility] requesting report on pt. Report given. All questions answered to nurse's satisfaction. Will notify this rn upon pt's discharge with updated information. Vw, rn.

## 2014-05-17 NOTE — ED Notes (Signed)
Pt ate 25% of her breakfast

## 2014-05-17 NOTE — ED Notes (Signed)
attempted calling pt's husband to update about pt's status with order to transfer; but unable to reach husband. Vwilliams,rn.

## 2014-05-17 NOTE — ED Notes (Signed)
PTAR came in to transport pt to [facility]; but pt is IVC and needs sheriff's transport. PTAR left and another call placed to [phone] to make another arrangement for transfer. Receiver on phone made aware that this is an involuntary commitment transfer. Appropriate arrangement made.

## 2014-05-17 NOTE — BH Assessment (Signed)
Pt has been accepted to [facility] by Dr. Ronnald Ramp. The number for nurse to nurse report is [phone]. The nurse name is Shirlee Limerick. Dr. Randal Buba has been informed of the disposition. Rip Harbour, RN has also been notified that pt has been accepted to [place].

## 2014-05-17 NOTE — Consult Note (Signed)
[facility] Face-to-Face Psychiatry Consult   Reason for Consult:  Psychosis Referring Physician:  EDP  Taylor Ramirez is an 58 y.o. female. Total Time spent with patient: 20 minutes  Assessment: AXIS I:  Psychotic Disorder NOS AXIS II:  Deferred AXIS III:   Past Medical History  Diagnosis Date  . Hypertension   . Asthma     Pulmonary function Tests 09-2006: mild asthma  . Hypothyroidism   . Anemia     h/o  . 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 Allergic rhinitis   . Heart murmur     ECHO 5/10 was neg  . Diabetes mellitus     able to control with diet and exercise   AXIS IV:  other psychosocial or environmental problems, problems related to social environment and problems with primary support group AXIS V:  21-30 behavior considerably influenced by delusions or hallucinations OR serious impairment in judgment, communication OR inability to function in almost all areas  Plan:  Recommend psychiatric Inpatient admission when medically cleared.  Dr.Victor Granados assessed the patient and concurs with the plan.  Subjective:   Taylor Ramirez is a 58 y.o. female patient admitted with psychosis.  HPI:  The patient is clearer today and is less confused today.  Today, she tells [place] she is retired from [employer] and has a husband, Jeneen Rinks, and has two "grown girls."  She states she was having paranoid thoughts a week ago; has had paranoia in the past, few years ago.  Railee stated she "got sick" and forgot to take her medications (could not name them).  She does state she has experienced memory loss.  Pleasant, sitting on her bed coloring in the lines of a printed picture.  Does know her sister and her dad had been in psychiatric hospitals but she denies being in any.  Confusion improved but impairment remains along with hallucinations which she states are better but not gone. HPI Elements:   Location:  generalized a. Quality:  acute. Severity:  severe. Timing:   constant. Duration:  few days. Context:  stressors.  Past Psychiatric History: Past Medical History  Diagnosis Date  . Hypertension   . Asthma     Pulmonary function Tests 09-2006: mild asthma  . Hypothyroidism   . Anemia     h/o  . 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 Allergic rhinitis   . Heart murmur     ECHO 5/10 was neg  . Diabetes mellitus     able to control with diet and exercise    reports that she has never smoked. She has never used smokeless tobacco. She reports that she drinks alcohol. She reports that she does not use illicit drugs. Family History  Problem Relation Age of Onset  . Hypertension Father   . Hypothyroidism Mother   . Graves' disease Daughter   . Diabetes Sister   . Heart attack Neg Hx   . Colon cancer Neg Hx   . Breast cancer Neg Hx            Allergies:  No Known Allergies  ACT Assessment Complete:  Yes:    Educational Status    Risk to Self: Risk to self with the past 6 months Is patient at risk for suicide?: No Substance abuse history and/or treatment for substance abuse?: No  Risk to Others:    Abuse:    Prior Inpatient Therapy:  Prior Outpatient Therapy:    Additional Information:                    Objective: Blood pressure 134/60, pulse 94, temperature 98.4 F (36.9 C), temperature source Oral, resp. rate 20, SpO2 100.00%.There is no weight on file to calculate BMI. Results for orders placed during the hospital encounter of 05/15/14 (from the past 72 hour(s))  CBC     Status: Abnormal   Collection Time    05/15/14  2:18 PM      Result Value Ref Range   WBC 15.6 (*) 4.0 - 10.5 K/uL   RBC 4.51  3.87 - 5.11 MIL/uL   Hemoglobin 13.1  12.0 - 15.0 g/dL   HCT 39.4  36.0 - 46.0 %   MCV 87.4  78.0 - 100.0 fL   MCH 29.0  26.0 - 34.0 pg   MCHC 33.2  30.0 - 36.0 g/dL   RDW 15.0  11.5 - 15.5 %   Platelets 335  150 - 400 K/uL  BASIC METABOLIC PANEL     Status: Abnormal   Collection  Time    05/15/14  2:18 PM      Result Value Ref Range   Sodium 139  137 - 147 mEq/L   Potassium 3.5 (*) 3.7 - 5.3 mEq/L   Chloride 98  96 - 112 mEq/L   CO2 27  19 - 32 mEq/L   Glucose, Bld 122 (*) 70 - 99 mg/dL   BUN 11  6 - 23 mg/dL   Creatinine, Ser 0.63  0.50 - 1.10 mg/dL   Calcium 9.7  8.4 - 10.5 mg/dL   GFR calc non Af Amer >90  >90 mL/min   GFR calc Af Amer >90  >90 mL/min   Comment: (NOTE)     The eGFR has been calculated using the CKD EPI equation.     This calculation has not been validated in all clinical situations.     eGFR's persistently <90 mL/min signify possible Chronic Kidney     Disease.   Anion gap 14  5 - 15  URINALYSIS, ROUTINE W REFLEX MICROSCOPIC     Status: None   Collection Time    05/15/14  3:50 PM      Result Value Ref Range   Color, Urine YELLOW  YELLOW   APPearance CLEAR  CLEAR   Specific Gravity, Urine 1.016  1.005 - 1.030   pH 6.5  5.0 - 8.0   Glucose, UA NEGATIVE  NEGATIVE mg/dL   Hgb urine dipstick NEGATIVE  NEGATIVE   Bilirubin Urine NEGATIVE  NEGATIVE   Ketones, ur NEGATIVE  NEGATIVE mg/dL   Protein, ur NEGATIVE  NEGATIVE mg/dL   Urobilinogen, UA 0.2  0.0 - 1.0 mg/dL   Nitrite NEGATIVE  NEGATIVE   Leukocytes, UA NEGATIVE  NEGATIVE   Comment: MICROSCOPIC NOT DONE ON URINES WITH NEGATIVE PROTEIN, BLOOD, LEUKOCYTES, NITRITE, OR GLUCOSE <1000 mg/dL.  I-STAT CG4 LACTIC ACID, ED     Status: None   Collection Time    05/15/14  4:08 PM      Result Value Ref Range   Lactic Acid, Venous 0.84  0.5 - 2.2 mmol/L  CBG MONITORING, ED     Status: Abnormal   Collection Time    05/15/14  9:57 PM      Result Value Ref Range   Glucose-Capillary 139 (*) 70 - 99 mg/dL  URINE RAPID DRUG SCREEN (HOSP PERFORMED)     Status: None  Collection Time    05/16/14 12:34 PM      Result Value Ref Range   Opiates NONE DETECTED  NONE DETECTED   Cocaine NONE DETECTED  NONE DETECTED   Benzodiazepines NONE DETECTED  NONE DETECTED   Amphetamines NONE DETECTED  NONE  DETECTED   Tetrahydrocannabinol NONE DETECTED  NONE DETECTED   Barbiturates NONE DETECTED  NONE DETECTED   Comment:            DRUG SCREEN FOR MEDICAL PURPOSES     ONLY.  IF CONFIRMATION IS NEEDED     FOR ANY PURPOSE, NOTIFY LAB     WITHIN 5 DAYS.                LOWEST DETECTABLE LIMITS     FOR URINE DRUG SCREEN     Drug Class       Cutoff (ng/mL)     Amphetamine      1000     Barbiturate      200     Benzodiazepine   696     Tricyclics       295     Opiates          300     Cocaine          300     THC              50   Labs are reviewed and are pertinent for no medical issues noted.  Current Facility-Administered Medications  Medication Dose Route Frequency Provider Last Rate Last Dose  . acetaminophen (TYLENOL) tablet 650 mg  650 mg Oral Q4H PRN Virgel Manifold, MD      . aspirin chewable tablet 81 mg  81 mg Oral Daily Virgel Manifold, MD   81 mg at 05/17/14 1022  . estrogens (conjugated) (PREMARIN) tablet 0.3 mg  0.3 mg Oral Daily Virgel Manifold, MD   0.3 mg at 05/17/14 0932  . irbesartan (AVAPRO) tablet 150 mg  150 mg Oral Daily Virgel Manifold, MD   150 mg at 05/17/14 1022   And  . hydrochlorothiazide (MICROZIDE) capsule 12.5 mg  12.5 mg Oral Daily Virgel Manifold, MD   12.5 mg at 05/17/14 1022  . levothyroxine (SYNTHROID, LEVOTHROID) tablet 150 mcg  150 mcg Oral QAC breakfast Virgel Manifold, MD   150 mcg at 05/17/14 0837  . LORazepam (ATIVAN) injection 2 mg  2 mg Intramuscular Once Virgel Manifold, MD      . ondansetron [facility]) tablet 4 mg  4 mg Oral Q8H PRN Virgel Manifold, MD      . risperiDONE (RISPERDAL) tablet 0.5 mg  0.5 mg Oral BID Waylan Boga, NP   0.5 mg at 05/16/14 1953   Current Outpatient Prescriptions  Medication Sig Dispense Refill  . aspirin 81 MG tablet Take 81 mg by mouth daily.        . diazepam (VALIUM) 10 MG tablet Take 10 mg by mouth every 6 (six) hours as needed for anxiety (anxiety & insomnia).      Marland Kitchen estrogens, conjugated, (PREMARIN) 0.3 MG tablet Take 0.3 mg  by mouth daily.       Marland Kitchen levothyroxine (SYNTHROID, LEVOTHROID) 150 MCG tablet Take 150 mcg by mouth daily before breakfast.      . valsartan-hydrochlorothiazide (DIOVAN-HCT) 160-12.5 MG per tablet Take 1 tablet by mouth daily.        Psychiatric Specialty Exam:     Blood pressure 134/60, pulse 94, temperature 98.4 F (36.9 C), temperature  source Oral, resp. rate 20, SpO2 100.00%.There is no weight on file to calculate BMI.  General Appearance: Casual  Eye Contact::  Good  Speech:  Normal Rate  Volume:  Normal  Mood:  :pleasant  Affect:  Congruent  Thought Process:  Disorganized but more clear today  Orientation:  Full (Time, Place, and Person)  Thought Content:  Delusions and Hallucinations: Visual and auditory  Suicidal Thoughts:  No  Homicidal Thoughts:  No  Memory:  Immediate;   Fair Recent;   Poor Remote;   Poor  Memory improved today  Judgement:  Impaired  Insight: Fair  Psychomotor Activity:  Decreased  Concentration:  Fair  Recall:  [place]: Fair  Akathisia:  No  Handed:  Right  AIMS (if indicated):     Assets:  Housing Leisure Time Physical Health Resilience  Sleep:      Musculoskeletal: Strength & Muscle Tone: within normal limits Gait & Station: normal Patient leans: N/A  Treatment Plan Summary: Daily contact with patient to assess and evaluate symptoms and progress in treatment Medication management; Risperdal 0.5 mg BID for irritability, psychosis started, transfer to [facility] for gero-psychiatry  Waylan Boga, [place] 05/17/2014 11:21 AM Patient seen and I agree with treatment and plan Levonne Spiller MD

## 2014-05-17 NOTE — ED Notes (Signed)
2 female Conservator, museum/gallery came to transport pt to [facility]. Upon realizing that pt was a female, sherriffs said they cannot transport pt being that they were female officers and that pt needed a female officer to transport. Call placed by sheriff officers requesting a female officer. Said someone will be in [place] pt up shortly.

## 2014-05-17 NOTE — Progress Notes (Signed)
Attempted calling report x 2 to [facility] medical center, but no answer. Left a message on the voicemail. Will cont to try calling back. Vwilliams,rn.

## 2014-05-17 NOTE — ED Notes (Signed)
Husband called to check on pt and requested updates. Updates given.

## 2014-05-17 NOTE — ED Notes (Signed)
2nd dose of keflex retrieved from pyxis after first dose fell to the floor.

## 2014-05-17 NOTE — Progress Notes (Signed)
Patient's husband came and did not want her transferred to [facility] due to distance. He wants her to go to outpatient or [facility].  I explained that [facility] has no beds and her severity does not permit outpatient at this time, twice with him without understanding on his part.  The bed situation was checked again at [facility], no beds.  The TTS counselor is currently trying to explain this to the husband.  The husband is elderly and stated his wife never had these symptoms before (hallucinations, memory loss, etc) but it is noted he told the EDP that Ly has been having hallucinations for several months and described the symptoms of hallucinations to the EDP---sees ghosts, etc.   Currently, he is not processing the information explained to him.  Waylan Boga, PMH-NP

## 2014-05-17 NOTE — ED Notes (Signed)
[address] at [facility] center called and updated on pt's departure. Last set of vitals reported per grace's request. Pt left unit in good condition ambulating to checkout accompanied by husband and female IT consultant. All documents sent by sheriff. No concrns voiced. Vw, rn.

## 2014-05-20 DIAGNOSIS — E876 Hypokalemia: Secondary | ICD-10-CM | POA: Insufficient documentation

## 2014-05-22 DIAGNOSIS — R945 Abnormal results of liver function studies: Secondary | ICD-10-CM | POA: Insufficient documentation

## 2014-05-22 DIAGNOSIS — R7989 Other specified abnormal findings of blood chemistry: Secondary | ICD-10-CM | POA: Insufficient documentation

## 2014-05-30 ENCOUNTER — Ambulatory Visit (INDEPENDENT_AMBULATORY_CARE_PROVIDER_SITE_OTHER): Payer: BC Managed Care – PPO | Admitting: Internal Medicine

## 2014-05-30 ENCOUNTER — Encounter: Payer: Self-pay | Admitting: Internal Medicine

## 2014-05-30 VITALS — BP 128/72 | HR 92 | Temp 98.7°F | Wt 278.4 lb

## 2014-05-30 DIAGNOSIS — Z23 Encounter for immunization: Secondary | ICD-10-CM

## 2014-05-30 DIAGNOSIS — I10 Essential (primary) hypertension: Secondary | ICD-10-CM

## 2014-05-30 DIAGNOSIS — F23 Brief psychotic disorder: Secondary | ICD-10-CM

## 2014-05-30 LAB — COMPREHENSIVE METABOLIC PANEL
ALK PHOS: 67 U/L (ref 39–117)
ALT: 87 U/L — ABNORMAL HIGH (ref 0–35)
AST: 59 U/L — ABNORMAL HIGH (ref 0–37)
Albumin: 3.9 g/dL (ref 3.5–5.2)
BUN: 16 mg/dL (ref 6–23)
CO2: 27 meq/L (ref 19–32)
Calcium: 9.1 mg/dL (ref 8.4–10.5)
Chloride: 104 mEq/L (ref 96–112)
Creat: 0.74 mg/dL (ref 0.50–1.10)
GLUCOSE: 88 mg/dL (ref 70–99)
POTASSIUM: 3.9 meq/L (ref 3.5–5.3)
SODIUM: 141 meq/L (ref 135–145)
TOTAL PROTEIN: 6.7 g/dL (ref 6.0–8.3)
Total Bilirubin: 0.3 mg/dL (ref 0.2–1.2)

## 2014-05-30 MED ORDER — POTASSIUM CHLORIDE ER 10 MEQ PO TBCR: 10.0000 meq | EXTENDED_RELEASE_TABLET | Freq: Every day | ORAL | Status: DC

## 2014-05-30 NOTE — Progress Notes (Signed)
Pre visit review using our clinic review tool, if applicable. No additional management support is needed unless otherwise documented below in the visit note. 

## 2014-05-30 NOTE — Patient Instructions (Signed)
Get your blood work before you leave    Please come back to the office in 3 months  for a routine check up   

## 2014-05-30 NOTE — Progress Notes (Signed)
Subjective:    Patient ID: Taylor Ramirez, female    DOB: 1956/02/09, 58 y.o.   MRN: [id number]  DOS:  05/30/2014 Type of visit - description : f/u Interval history: Was recently seen at the ER with episode of psychosis,  transferred to a inpatient unit in [facility], reports she's taking psych medication but does not recall the names. Was told to check her potassium and liver tests once she is out of the hospital. ER records reviewed and confirmatory of her history of admision to [facility] Denies suicidal ideas at this point, reports anxiety depression are well-controlled No chest pain or difficulty breathing No nausea, vomiting, diarrhea  Past Medical History  Diagnosis Date  . Hypertension   . Asthma     Pulmonary function Tests 09-2006: mild asthma  . Hypothyroidism   . Anemia     h/o  . 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 Allergic rhinitis   . Heart murmur     ECHO 5/10 was neg  . Diabetes mellitus     able to control with diet and exercise    Past Surgical History  Procedure Laterality Date  . Tonsillectomy    . Abdominal hysterectomy  2011    no oophorectomy    History   Social History  . Marital Status: Married    Spouse Name: N/A    Number of Children: 1  . Years of Education: N/A   Occupational History  . [employer]    Social History Main Topics  . Smoking status: Never Smoker   . Smokeless tobacco: Never Used  . Alcohol Use: Yes     Comment: socially  . Drug Use: No  . Sexual Activity: Not on file   Other Topics Concern  . Not on file   Social History Narrative  . No narrative on file        Medication List       This list is accurate as of: 05/30/14  3:25 PM.  Always use your most recent med list.               aspirin 81 MG tablet  Take 81 mg by mouth daily.     estrogens (conjugated) 0.3 MG tablet  Commonly known as:  PREMARIN  Take 0.3 mg by mouth daily.     levothyroxine 150 MCG tablet  Commonly known as:  SYNTHROID, LEVOTHROID  Take 150 mcg by mouth daily before breakfast.     potassium chloride 10 MEQ tablet  Commonly known as:  K-DUR  Take 10 mEq by mouth daily.     valsartan-hydrochlorothiazide 160-12.5 MG per tablet  Commonly known as:  DIOVAN-HCT  Take 1 tablet by mouth daily.           Objective:   Physical Exam BP 128/72  Pulse 92  Temp(Src) 98.7 F (37.1 C) (Oral)  Wt 278 lb 6 oz (126.27 kg)  SpO2 93%  General -- alert, well-developed, NAD.   Lungs -- normal respiratory effort, no intercostal retractions, no accessory muscle use, and normal breath sounds.  Heart-- normal rate, regular rhythm, no murmur.   Neurologic--  alert & oriented X3. Speech normal Slightly slow by coherent Psych-- Cognition and judgment appear intact. Cooperative with normal attention span and concentration. No anxious or depressed appearing.       Assessment & Plan:   Today , I spent more than 25   min  with the patient: Mostly reviewing the chart and labs ordered by other providers  Also coordinating his care       Flu shot provided today

## 2014-05-31 NOTE — Assessment & Plan Note (Signed)
  Hypertension, Was told to have her potassium and LFTs checked after she was discharged from a psych  inpatient facility, is not exactly clear why but I think is important to proceed as recommended and check labs

## 2014-05-31 NOTE — Assessment & Plan Note (Signed)
Psychosis, Status post ER eval and  admission in [facility], taking 2 type of new medications, I asked the patient to call us back and let us know what medications she is on. F/u with psychiatry

## 2014-06-02 MED ORDER — POTASSIUM CHLORIDE ER 10 MEQ PO TBCR: 10.0000 meq | EXTENDED_RELEASE_TABLET | Freq: Every day | ORAL | Status: DC

## 2014-06-02 NOTE — Addendum Note (Signed)
Addended by: Wilfrid Lund on: 06/02/2014 03:51 PM   Modules accepted: Orders

## 2014-06-06 MED ORDER — POTASSIUM CHLORIDE ER 10 MEQ PO TBCR: 10.0000 meq | EXTENDED_RELEASE_TABLET | Freq: Every day | ORAL | Status: DC

## 2014-06-06 NOTE — Addendum Note (Signed)
Addended by: Wilfrid Lund on: 06/06/2014 09:59 AM   Modules accepted: Orders

## 2014-06-09 ENCOUNTER — Other Ambulatory Visit: Payer: Self-pay

## 2014-06-09 MED ORDER — VALSARTAN-HYDROCHLOROTHIAZIDE 160-12.5 MG PO TABS
1.0000 | ORAL_TABLET | Freq: Every day | ORAL | Status: DC
Start: 2014-06-09 — End: 2014-10-20

## 2014-08-19 LAB — HM PAP SMEAR: HM PAP: NORMAL

## 2014-08-25 ENCOUNTER — Ambulatory Visit (INDEPENDENT_AMBULATORY_CARE_PROVIDER_SITE_OTHER): Payer: BLUE CROSS/BLUE SHIELD | Admitting: Internal Medicine

## 2014-08-25 ENCOUNTER — Encounter: Payer: Self-pay | Admitting: Internal Medicine

## 2014-08-25 VITALS — BP 107/68 | HR 91 | Temp 97.8°F | Ht 62.0 in | Wt 286.0 lb

## 2014-08-25 DIAGNOSIS — E038 Other specified hypothyroidism: Secondary | ICD-10-CM

## 2014-08-25 DIAGNOSIS — R55 Syncope and collapse: Secondary | ICD-10-CM

## 2014-08-25 DIAGNOSIS — E119 Type 2 diabetes mellitus without complications: Secondary | ICD-10-CM

## 2014-08-25 DIAGNOSIS — E034 Atrophy of thyroid (acquired): Secondary | ICD-10-CM

## 2014-08-25 DIAGNOSIS — F23 Brief psychotic disorder: Secondary | ICD-10-CM

## 2014-08-25 DIAGNOSIS — I10 Essential (primary) hypertension: Secondary | ICD-10-CM

## 2014-08-25 NOTE — Progress Notes (Signed)
Subjective:    Patient ID: Taylor Ramirez, female    DOB: January 18, 1956, 59 y.o.   MRN: [id number]  DOS:  08/25/2014 Type of visit - description : f/u Interval history: Episode of psychosis, currently on Zoloft and Seroquel, since that time she was admitted to the hospital she has not seen the psychiatrist on follow-up. Diabetes, on no medication, likes her A1c checked Hypothyroidism, due for a TSH Also complained of presyncope spells, had ~ 3 of them since October when she left the hospital, episodes are described as a sudden    "collapse", like she gets completely weak, denies loss of consciousness, one time she hurt her knees when she fell. No associated symptoms. Sometimes has happen when she stand up and felt dizzy, other time while  she is walking.   ROS  Denies recent chest pain, difficulty breathing or palpitations No diplopia, slurred speech or motor deficits No head injury, no HAs Emotionally she reports she is doing okay although her husband had a heart attack this month.   Past Medical History  Diagnosis Date  . Hypertension   . Asthma     Pulmonary function Tests 09-2006: mild asthma  . Hypothyroidism   . Anemia     h/o  . 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 Allergic rhinitis   . Heart murmur     ECHO 5/10 was neg  . Diabetes mellitus     able to control with diet and exercise  . Psychosis 05/16/2014    Past Surgical History  Procedure Laterality Date  . Tonsillectomy    . Abdominal hysterectomy  2011    no oophorectomy    History   Social History  . Marital Status: Married    Spouse Name: N/A    Number of Children: 1  . Years of Education: N/A   Occupational History  . [employer]    Social History Main Topics  . Smoking status: Never Smoker   . Smokeless tobacco: Never Used  . Alcohol Use: Yes     Comment: socially  . Drug Use: No  . Sexual Activity: Not on file   Other Topics Concern  . Not on file   Social  History Narrative        Medication List       This list is accurate as of: 08/25/14 11:59 PM.  Always use your most recent med list.               aspirin 81 MG tablet  Take 81 mg by mouth daily.     estrogens (conjugated) 0.3 MG tablet  Commonly known as:  PREMARIN  Take 0.3 mg by mouth daily.     levothyroxine 150 MCG tablet  Commonly known as:  SYNTHROID, LEVOTHROID  Take 150 mcg by mouth daily before breakfast.     potassium chloride 10 MEQ tablet  Commonly known as:  K-DUR  Take 1 tablet (10 mEq total) by mouth daily.     QUEtiapine 50 MG tablet  Commonly known as:  SEROQUEL  Take 150 mg by mouth at bedtime.     sertraline 100 MG tablet  Commonly known as:  ZOLOFT  Take 100 mg by mouth daily.     valsartan-hydrochlorothiazide 160-12.5 MG per tablet  Commonly known as:  DIOVAN-HCT  Take 1 tablet by mouth daily.           Objective:   Physical Exam BP 107/68 mmHg  Pulse 91  Temp(Src) 97.8 F (36.6 C) (Oral)  Ht 5\' 2"  (1.575 m)  Wt 286 lb (129.729 kg)  BMI 52.30 kg/m2  SpO2 95% General -- alert, well-developed, NAD.  Neck --no thyromegaly , normal carotid pulse  HEENT-- Not pale.   Lungs -- normal respiratory effort, no intercostal retractions, no accessory muscle use, and normal breath sounds.  Heart-- normal rate, regular rhythm, no murmur.   Extremities-- trace pretibial edema bilaterally  Neurologic--  alert & oriented X3. Speech normal, gait appropriate for age, strength symmetric and appropriate for age. EOMI, PERRLA Psych-- Cognition and judgment appear intact. Cooperative with normal attention span and concentration. No anxious or depressed appearing.         Assessment & Plan:

## 2014-08-25 NOTE — Assessment & Plan Note (Signed)
Continue Diovan HCT, KCl, check a BMP

## 2014-08-25 NOTE — Assessment & Plan Note (Signed)
On diet control, check A1c 

## 2014-08-25 NOTE — Progress Notes (Signed)
Pre visit review using our clinic review tool, if applicable. No additional management support is needed unless otherwise documented below in the visit note. 

## 2014-08-25 NOTE — Assessment & Plan Note (Signed)
Currently on Zoloft and Seroquel, missed her appointment with psychiatry due to her husband having a heart attack, recommend to reschedule as soon as possible

## 2014-08-25 NOTE — Patient Instructions (Signed)
Get your blood work before you leave    Please come back to the office in 4 months  for a routine check up    Please see your psychiatrist ASAP

## 2014-08-25 NOTE — Assessment & Plan Note (Addendum)
3 episodes of near syncope as described in the history of present illness since  05-2014, since then she's taking Zoloft and Seroquel. I did a  EKG today, QT is not prolonged I'm still somewhat concerned about this problem, will refer to cardiology. (I sent a  message to cards DOD, hold seroquel before referral? Was recommend to send the patient first)

## 2014-08-25 NOTE — Assessment & Plan Note (Addendum)
Continue Synthroid 150 mcg, check labs; patient strongly requested to check free T3 T4

## 2014-08-26 LAB — COMPREHENSIVE METABOLIC PANEL
ALBUMIN: 3.9 g/dL (ref 3.5–5.2)
ALK PHOS: 66 U/L (ref 39–117)
ALT: 17 U/L (ref 0–35)
AST: 18 U/L (ref 0–37)
BUN: 16 mg/dL (ref 6–23)
CO2: 32 meq/L (ref 19–32)
CREATININE: 0.72 mg/dL (ref 0.40–1.20)
Calcium: 9.6 mg/dL (ref 8.4–10.5)
Chloride: 101 mEq/L (ref 96–112)
GFR: 107.22 mL/min (ref >60.00)
GLUCOSE: 110 mg/dL — AB (ref 70–99)
Potassium: 3.9 mEq/L (ref 3.5–5.1)
Sodium: 138 mEq/L (ref 135–145)
Total Bilirubin: 0.3 mg/dL (ref 0.2–1.2)
Total Protein: 7.6 g/dL (ref 6.0–8.3)

## 2014-08-26 LAB — TSH: TSH: 0.64 u[IU]/mL (ref 0.35–4.50)

## 2014-08-26 LAB — CBC WITH DIFFERENTIAL/PLATELET
Basophils Absolute: 0.1 10*3/uL (ref 0.0–0.1)
Basophils Relative: 0.5 % (ref 0.0–3.0)
Eosinophils Absolute: 0.6 10*3/uL (ref 0.0–0.7)
Eosinophils Relative: 5.6 % — ABNORMAL HIGH (ref 0.0–5.0)
HCT: 35.9 % — ABNORMAL LOW (ref 36.0–46.0)
HEMOGLOBIN: 11.4 g/dL — AB (ref 12.0–15.0)
LYMPHS ABS: 3.2 10*3/uL (ref 0.7–4.0)
LYMPHS PCT: 30.7 % (ref 12.0–46.0)
MCHC: 31.8 g/dL (ref 30.0–36.0)
MCV: 87.1 fl (ref 78.0–100.0)
MONOS PCT: 3.3 % (ref 3.0–12.0)
Monocytes Absolute: 0.3 10*3/uL (ref 0.1–1.0)
Neutro Abs: 6.2 10*3/uL (ref 1.4–7.7)
Neutrophils Relative %: 59.9 % (ref 43.0–77.0)
Platelets: 308 10*3/uL (ref 150.0–400.0)
RBC: 4.12 Mil/uL (ref 3.87–5.11)
RDW: 16.2 % — ABNORMAL HIGH (ref 11.5–15.5)
WBC: 10.4 10*3/uL (ref 4.0–10.5)

## 2014-08-26 LAB — HEMOGLOBIN A1C: Hgb A1c MFr Bld: 6.2 % (ref 4.6–6.5)

## 2014-08-26 LAB — T4, FREE: Free T4: 1.16 ng/dL (ref 0.60–1.60)

## 2014-08-26 LAB — T3, FREE: T3, Free: 2.6 pg/mL (ref 2.3–4.2)

## 2014-08-27 ENCOUNTER — Encounter: Payer: Self-pay | Admitting: Internal Medicine

## 2014-09-17 NOTE — Progress Notes (Signed)
erron

## 2014-09-19 ENCOUNTER — Encounter: Payer: Self-pay | Admitting: Cardiovascular Disease

## 2014-10-09 ENCOUNTER — Encounter: Payer: Self-pay | Admitting: Cardiovascular Disease

## 2014-10-09 ENCOUNTER — Telehealth: Payer: Self-pay | Admitting: Cardiovascular Disease

## 2014-10-09 NOTE — Telephone Encounter (Signed)
error 

## 2014-10-20 ENCOUNTER — Other Ambulatory Visit: Payer: Self-pay | Admitting: Internal Medicine

## 2014-11-12 DIAGNOSIS — R011 Cardiac murmur, unspecified: Secondary | ICD-10-CM | POA: Insufficient documentation

## 2014-12-04 ENCOUNTER — Other Ambulatory Visit: Payer: Self-pay

## 2014-12-26 ENCOUNTER — Ambulatory Visit: Payer: Self-pay | Admitting: Internal Medicine

## 2015-01-15 ENCOUNTER — Other Ambulatory Visit: Payer: Self-pay

## 2015-01-19 ENCOUNTER — Ambulatory Visit: Payer: Self-pay | Admitting: Internal Medicine

## 2015-01-20 ENCOUNTER — Encounter: Payer: Self-pay | Admitting: Internal Medicine

## 2015-01-20 ENCOUNTER — Telehealth: Payer: Self-pay | Admitting: Internal Medicine

## 2015-01-20 NOTE — Telephone Encounter (Signed)
Ok to charge unless is the first no show

## 2015-01-20 NOTE — Telephone Encounter (Signed)
Pt was no show for follow up appt on 01/19/15- sent letter. Charge?

## 2015-03-09 DIAGNOSIS — N898 Other specified noninflammatory disorders of vagina: Secondary | ICD-10-CM

## 2015-03-09 DIAGNOSIS — N760 Acute vaginitis: Secondary | ICD-10-CM

## 2015-03-09 HISTORY — DX: Other specified noninflammatory disorders of vagina: N89.8

## 2015-03-09 HISTORY — DX: Acute vaginitis: N76.0

## 2015-03-29 ENCOUNTER — Other Ambulatory Visit: Payer: Self-pay | Admitting: Internal Medicine

## 2015-03-31 ENCOUNTER — Other Ambulatory Visit: Payer: Self-pay

## 2015-04-23 ENCOUNTER — Other Ambulatory Visit: Payer: Self-pay | Admitting: Internal Medicine

## 2015-04-23 ENCOUNTER — Other Ambulatory Visit: Payer: Self-pay

## 2015-06-04 ENCOUNTER — Telehealth: Payer: Self-pay | Admitting: Internal Medicine

## 2015-06-04 MED ORDER — VALSARTAN-HYDROCHLOROTHIAZIDE 160-12.5 MG PO TABS: 1.0000 | ORAL_TABLET | Freq: Every day | ORAL | Status: DC

## 2015-06-04 NOTE — Telephone Encounter (Signed)
LVM advising patient of below message

## 2015-06-04 NOTE — Telephone Encounter (Signed)
°  Relation to pt: self  Call back number:[phone] Pharmacy: [facility], [place]  Reason for call:  Patient is completely out of valsartan-hydrochlorothiazide (DIOVAN-HCT) 160-12.5 MG per tablet requesting medication please sent to retail

## 2015-06-04 NOTE — Telephone Encounter (Signed)
Rx sent to [facility], #30 tablets and 0 refills. Pt has not been seen since 08/2014. Overdue for routine F/U. Please have Pt schedule appt before next refill. Thank you.

## 2015-06-17 ENCOUNTER — Telehealth: Payer: Self-pay | Admitting: Internal Medicine

## 2015-06-17 MED ORDER — SYNTHROID 150 MCG PO TABS: 150.0000 ug | ORAL_TABLET | Freq: Every day | ORAL | Status: DC

## 2015-06-17 NOTE — Telephone Encounter (Signed)
Relation to VD:PBAQ Call back [phone] Pharmacy: [facility], [place] [phone] (Phone) [phone] (Fax)         Reason for call:  Patient requesting a refill SYNTHROID 150 MCG tablet patient scheduled follow up for 06/22/15.

## 2015-06-17 NOTE — Telephone Encounter (Signed)
Synthroid sent to [facility] as requested, #30 and 0 RF.

## 2015-06-22 ENCOUNTER — Telehealth: Payer: Self-pay | Admitting: Internal Medicine

## 2015-06-22 ENCOUNTER — Ambulatory Visit: Payer: Self-pay | Admitting: Internal Medicine

## 2015-06-23 ENCOUNTER — Telehealth: Payer: Self-pay

## 2015-06-23 NOTE — Telephone Encounter (Signed)
Pt has no showed:   04/12/2013 F/U appt 01/19/2015 F/U appt 06/22/2015 F/U appt  Has cancelled:  03/18/2013 F/U appt 03/19/2013 F/U appt 03/26/2013 F/U appt 12/26/2014 F/U appt  Begin dismissal process?

## 2015-06-24 NOTE — Telephone Encounter (Signed)
Charge. Multiple no shows and cancellations.

## 2015-06-24 NOTE — Telephone Encounter (Addendum)
Send a letter Taylor Ramirez: You did not show for your appointment 06/22/2015, this is the third time you don't show. It is extremely important you have regular checkups as recommended. Please schedule a follow-up at this office as soon as you can, please call the front desk. Unfortunately we will have to reconsider your relationship with this office if you continue to no show

## 2015-06-24 NOTE — Telephone Encounter (Signed)
Pt was no show 06/22/15 4:00pm for follow up appt, pt did not reschedule, charge or no charge?

## 2015-06-24 NOTE — Telephone Encounter (Signed)
Letter printed and mailed to Pt.  

## 2015-07-15 ENCOUNTER — Telehealth: Payer: Self-pay | Admitting: Internal Medicine

## 2015-07-15 ENCOUNTER — Ambulatory Visit: Payer: Self-pay | Admitting: Internal Medicine

## 2015-07-15 MED ORDER — VALSARTAN-HYDROCHLOROTHIAZIDE 160-12.5 MG PO TABS: 1.0000 | ORAL_TABLET | Freq: Every day | ORAL | Status: DC

## 2015-07-15 MED ORDER — SYNTHROID 150 MCG PO TABS: 150.0000 ug | ORAL_TABLET | Freq: Every day | ORAL | Status: DC

## 2015-07-15 NOTE — Telephone Encounter (Signed)
Rx's sent, #15 and 0 RF until seen.

## 2015-07-15 NOTE — Telephone Encounter (Signed)
Caller name: Merina Relation to pt: self  Call back number: [phone] Pharmacy: [facility]  Reason for call: Pt came in late for her appt today 07-15-15 so pt rescheduled but stated is needing meds for SYNTHROID 150 MCG tablet and valsartan-hydrochlorothiazide (DIOVAN-HCT) 160-12.5 MG tablet, Please advise.

## 2015-07-17 NOTE — Telephone Encounter (Signed)
Pt had appt 07/15/15 1:00pm, she came in late for appt, pt has 3 prior no shows, charge or no charge?

## 2015-07-17 NOTE — Telephone Encounter (Signed)
Charge. 

## 2015-07-20 ENCOUNTER — Telehealth: Payer: Self-pay | Admitting: Internal Medicine

## 2015-07-20 ENCOUNTER — Ambulatory Visit: Payer: Self-pay | Admitting: Internal Medicine

## 2015-07-20 DIAGNOSIS — Z0289 Encounter for other administrative examinations: Secondary | ICD-10-CM

## 2015-07-20 NOTE — Telephone Encounter (Signed)
Pt called in stating she will not be at appt today. She states and emergency and she just can't come. Pt rescheduled for Wed 12/14 bc she said she needs meds. Charge or no charge?

## 2015-07-20 NOTE — Telephone Encounter (Signed)
Charge, multiple no shows and cancellations.

## 2015-07-22 ENCOUNTER — Ambulatory Visit (INDEPENDENT_AMBULATORY_CARE_PROVIDER_SITE_OTHER): Payer: Self-pay | Admitting: Internal Medicine

## 2015-07-22 ENCOUNTER — Encounter: Payer: Self-pay | Admitting: Internal Medicine

## 2015-07-22 VITALS — BP 128/76 | HR 78 | Temp 97.5°F | Ht 62.0 in | Wt 288.1 lb

## 2015-07-22 DIAGNOSIS — Z114 Encounter for screening for human immunodeficiency virus [HIV]: Secondary | ICD-10-CM

## 2015-07-22 DIAGNOSIS — E785 Hyperlipidemia, unspecified: Secondary | ICD-10-CM

## 2015-07-22 DIAGNOSIS — Z23 Encounter for immunization: Secondary | ICD-10-CM

## 2015-07-22 DIAGNOSIS — I10 Essential (primary) hypertension: Secondary | ICD-10-CM

## 2015-07-22 DIAGNOSIS — Z1159 Encounter for screening for other viral diseases: Secondary | ICD-10-CM

## 2015-07-22 DIAGNOSIS — Z09 Encounter for follow-up examination after completed treatment for conditions other than malignant neoplasm: Secondary | ICD-10-CM

## 2015-07-22 DIAGNOSIS — E119 Type 2 diabetes mellitus without complications: Secondary | ICD-10-CM

## 2015-07-22 DIAGNOSIS — E039 Hypothyroidism, unspecified: Secondary | ICD-10-CM

## 2015-07-22 LAB — CBC WITH DIFFERENTIAL/PLATELET
BASOS ABS: 0 10*3/uL (ref 0.0–0.1)
Basophils Relative: 0.4 % (ref 0.0–3.0)
EOS ABS: 0.5 10*3/uL (ref 0.0–0.7)
Eosinophils Relative: 4.3 % (ref 0.0–5.0)
HCT: 35.7 % — ABNORMAL LOW (ref 36.0–46.0)
Hemoglobin: 11.6 g/dL — ABNORMAL LOW (ref 12.0–15.0)
Lymphocytes Relative: 31 % (ref 12.0–46.0)
Lymphs Abs: 3.5 10*3/uL (ref 0.7–4.0)
MCHC: 32.5 g/dL (ref 30.0–36.0)
MCV: 85.3 fl (ref 78.0–100.0)
Monocytes Absolute: 0.7 10*3/uL (ref 0.1–1.0)
Monocytes Relative: 6 % (ref 3.0–12.0)
NEUTROS PCT: 58.3 % (ref 43.0–77.0)
Neutro Abs: 6.6 10*3/uL (ref 1.4–7.7)
Platelets: 303 10*3/uL (ref 150.0–400.0)
RBC: 4.18 Mil/uL (ref 3.87–5.11)
RDW: 17.4 % — ABNORMAL HIGH (ref 11.5–15.5)
WBC: 11.4 10*3/uL — AB (ref 4.0–10.5)

## 2015-07-22 LAB — TSH: TSH: 1.45 u[IU]/mL (ref 0.35–4.50)

## 2015-07-22 LAB — BASIC METABOLIC PANEL
BUN: 11 mg/dL (ref 6–23)
CALCIUM: 9.2 mg/dL (ref 8.4–10.5)
CO2: 30 mEq/L (ref 19–32)
Chloride: 102 mEq/L (ref 96–112)
Creatinine, Ser: 0.56 mg/dL (ref 0.40–1.20)
GFR: 142.84 mL/min (ref >60.00)
GLUCOSE: 88 mg/dL (ref 70–99)
Potassium: 3.8 mEq/L (ref 3.5–5.1)
SODIUM: 139 meq/L (ref 135–145)

## 2015-07-22 LAB — LIPID PANEL
CHOL/HDL RATIO: 3
Cholesterol: 161 mg/dL (ref 0–200)
HDL: 54.3 mg/dL (ref >39.00)
LDL Cholesterol: 91 mg/dL (ref 0–99)
NONHDL: 106.49
Triglycerides: 79 mg/dL (ref 0.0–149.0)
VLDL: 15.8 mg/dL (ref 0.0–40.0)

## 2015-07-22 LAB — AST: AST: 19 U/L (ref 0–37)

## 2015-07-22 LAB — ALT: ALT: 18 U/L (ref 0–35)

## 2015-07-22 LAB — HEMOGLOBIN A1C: HEMOGLOBIN A1C: 6.1 % (ref 4.6–6.5)

## 2015-07-22 MED ORDER — POTASSIUM CHLORIDE ER 10 MEQ PO TBCR: 10.0000 meq | EXTENDED_RELEASE_TABLET | Freq: Every day | ORAL | Status: AC

## 2015-07-22 MED ORDER — VALSARTAN-HYDROCHLOROTHIAZIDE 160-12.5 MG PO TABS: 1.0000 | ORAL_TABLET | Freq: Every day | ORAL | Status: DC

## 2015-07-22 MED ORDER — SYNTHROID 150 MCG PO TABS: 150.0000 ug | ORAL_TABLET | Freq: Every day | ORAL | Status: DC

## 2015-07-22 NOTE — Progress Notes (Signed)
Pre visit review using our clinic review tool, if applicable. No additional management support is needed unless otherwise documented below in the visit note. 

## 2015-07-22 NOTE — Progress Notes (Signed)
Subjective:    Patient ID: Taylor Ramirez, female    DOB: 1956-05-10, 59 y.o.   MRN: [id number]  DOS:  07/22/2015 Type of visit - description : Routine office visit Interval history: DM, on diet control: Due for labs Psychosis: Under the care of psychiatry, reports that she feels well Near-syncope: Symptoms have subsided, she never got to see cardiology HTN: Good compliance with medications, ambulatory BPs normal Hypothyroidism: Good compliance with meds, due for labs   Review of Systems No chest pain or difficulty breathing. No lower extremity edema. No nausea or vomiting.   Past Medical History  Diagnosis Date  . Hypertension   . Asthma     Pulmonary function Tests 09-2006: mild asthma  . Hypothyroidism   . Anemia     h/o  . 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 Allergic rhinitis   . Heart murmur     ECHO 5/10 was neg  . Diabetes mellitus     able to control with diet and exercise  . 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 Vaginal infection 03/2015  . Vaginal discharge 03/2015    Past Surgical History  Procedure Laterality Date  . Tonsillectomy    . Abdominal hysterectomy  2011    no oophorectomy    Social History   Social History  . Marital Status: Married    Spouse Name: N/A  . Number of Children: 1  . Years of Education: N/A   Occupational History  . not working-retired    Social History Main Topics  . Smoking status: Never Smoker   . Smokeless tobacco: Never Used  . Alcohol Use: Yes     Comment: socially  . Drug Use: No  . Sexual Activity: Not on file   Other Topics Concern  . Not on file   Social History Narrative   Lives w/ husband        Medication List       This list is accurate as of: 07/22/15 11:59 PM.  Always use your most recent med list.               aspirin 81 MG tablet  Take 81 mg by mouth daily.     estradiol 0.5 MG tablet  Commonly known as:  ESTRACE  Take 0.5 mg by mouth daily.     ketoconazole 2 % cream  Commonly known as:  NIZORAL  Apply 1 application topically 2 (two) times daily. To affected area     potassium chloride 10 MEQ tablet  Commonly known as:  K-DUR  Take 1 tablet (10 mEq total) by mouth daily.     QUEtiapine 50 MG tablet  Commonly known as:  SEROQUEL  Take 150 mg by mouth at bedtime.     sertraline 100 MG tablet  Commonly known as:  ZOLOFT  Take 100 mg by mouth daily.     SYNTHROID 150 MCG tablet  Generic drug:  levothyroxine  Take 1 tablet (150 mcg total) by mouth daily before breakfast.     valsartan-hydrochlorothiazide 160-12.5 MG tablet  Commonly known as:  DIOVAN-HCT  Take 1 tablet by mouth daily.           Objective:   Physical Exam BP 128/76 mmHg  Pulse 78  Temp(Src) 97.5 F (36.4 C) (Oral)  Ht 5\' 2"  (1.575 m)  Wt 288 lb 2 oz (130.693 kg)  BMI 52.69 kg/m2  SpO2 97% General:   Well developed, well nourished .  NAD.  HEENT:  Normocephalic . Face symmetric, atraumatic Lungs:  CTA B Normal respiratory effort, no intercostal retractions, no accessory muscle use. Heart: RRR,  no murmur.  No pretibial edema bilaterally  Diabetic foot exam: Dry skin (recommend lotions), normal pedal pulses, pinprick examination normal. No edema Skin: Not pale. Not jaundice Neurologic:  alert & oriented X3.  Speech normal, gait appropriate for age and unassisted Psych--   Behavior appropriate. No anxious or depressed appearing.      Assessment & Plan:   Assessment DM, diet control HTN Hypothyroidism Morbid obesity, BMI 52 Premature surgical menopause, on HRT Asthma, PFTs 2008: Mild Heart murmur, negative echo 12/2008  H/o  anemia H/o Psychosis 05-2014. Admitted to [facility], sees psych  Plan: DM: Check A1c. Feet exam normal today HTN: Seems well-controlled, refill medications, check a BMP and CBC Hypothyroidism: Continue synthroid, check a TSH Psychosis: Reportedly well-controlled, sees psychiatry.  Near-syncope: Was  referred to cardiology,that never happened, symptoms subsided Primary care: Flu  and pneumonia shot today. Check HIV and hep C RTC 4 months, fasting for a physical exam

## 2015-07-22 NOTE — Patient Instructions (Signed)
Get your blood work before you leave      Next visit  for a  routine physical exam in 4 months, fasting    Please schedule an appointment at the front desk

## 2015-07-23 DIAGNOSIS — Z09 Encounter for follow-up examination after completed treatment for conditions other than malignant neoplasm: Secondary | ICD-10-CM | POA: Insufficient documentation

## 2015-07-23 LAB — HEPATITIS C ANTIBODY: HCV AB: NEGATIVE

## 2015-07-23 LAB — HIV ANTIBODY (ROUTINE TESTING W REFLEX): HIV 1&2 Ab, 4th Generation: NONREACTIVE

## 2015-07-23 NOTE — Assessment & Plan Note (Signed)
DM: Check A1c. Feet exam normal today HTN: Seems well-controlled, refill medications, check a BMP and CBC Hypothyroidism: Continue synthroid, check a TSH Psychosis: Reportedly well-controlled, sees psychiatry.  Near-syncope: Was referred to cardiology,that never happened, symptoms subsided Primary care: Flu  and pneumonia shot today. Check HIV and hep C RTC 4 months, fasting for a physical exam

## 2015-11-24 ENCOUNTER — Encounter: Payer: Self-pay | Admitting: Internal Medicine

## 2016-03-03 ENCOUNTER — Telehealth: Payer: Self-pay | Admitting: Internal Medicine

## 2016-03-03 ENCOUNTER — Ambulatory Visit: Payer: Self-pay | Admitting: Internal Medicine

## 2016-03-07 ENCOUNTER — Other Ambulatory Visit: Payer: Self-pay | Admitting: Internal Medicine

## 2016-03-07 NOTE — Telephone Encounter (Signed)
Please inform Pt that 30 day supply sent to pharmacy, however, she is overdue for F/U appt. Last OV 07/2015 and recommended to follow-up in 4 months. Please schedule appt at Pt's convenience. Thank you.

## 2016-03-07 NOTE — Telephone Encounter (Signed)
°  Relation to [id number] Call back [phone] Pharmacy: [facility]  Reason for call: pt is needing rx SYNTHROID 150 MCG tablet. Pt states she is completely out and is needing the meds today.

## 2016-03-09 NOTE — Telephone Encounter (Signed)
Pt was no show 03/03/16 for follow up appt, 4th no show w/in 12 months, pt has not rescheduled, charge or no charge?

## 2016-03-09 NOTE — Telephone Encounter (Signed)
Would you like to begin dismissal process?

## 2016-03-10 NOTE — Telephone Encounter (Signed)
Dismissal letter and form completed and forwarded to [place], Marketing executive.

## 2016-03-10 NOTE — Telephone Encounter (Signed)
4th no show: start dismissal process

## 2016-03-17 ENCOUNTER — Telehealth: Payer: Self-pay | Admitting: Internal Medicine

## 2016-03-17 NOTE — Telephone Encounter (Signed)
Dismissed from [facility] by Dr. Colon Branch on [id number] Certified letter mailed on 03/17/16 CN

## 2016-03-27 ENCOUNTER — Encounter (HOSPITAL_COMMUNITY): Payer: Self-pay | Admitting: Emergency Medicine

## 2016-03-27 ENCOUNTER — Emergency Department (HOSPITAL_COMMUNITY)

## 2016-03-27 ENCOUNTER — Emergency Department (HOSPITAL_COMMUNITY)
Admission: EM | Admit: 2016-03-27 | Discharge: 2016-03-28 | Disposition: A | Discharge status: Other Acute Inpatient Hospital | Attending: Emergency Medicine | Admitting: Emergency Medicine

## 2016-03-27 DIAGNOSIS — E039 Hypothyroidism, unspecified: Secondary | ICD-10-CM | POA: Diagnosis not present

## 2016-03-27 DIAGNOSIS — I10 Essential (primary) hypertension: Secondary | ICD-10-CM | POA: Diagnosis not present

## 2016-03-27 DIAGNOSIS — Z79899 Other long term (current) drug therapy: Secondary | ICD-10-CM | POA: Diagnosis not present

## 2016-03-27 DIAGNOSIS — R4182 Altered mental status, unspecified: Secondary | ICD-10-CM

## 2016-03-27 DIAGNOSIS — Z7982 Long term (current) use of aspirin: Secondary | ICD-10-CM | POA: Insufficient documentation

## 2016-03-27 DIAGNOSIS — E119 Type 2 diabetes mellitus without complications: Secondary | ICD-10-CM | POA: Diagnosis not present

## 2016-03-27 DIAGNOSIS — J45909 Unspecified asthma, uncomplicated: Secondary | ICD-10-CM | POA: Diagnosis not present

## 2016-03-27 DIAGNOSIS — F209 Schizophrenia, unspecified: Secondary | ICD-10-CM | POA: Diagnosis present

## 2016-03-27 LAB — URINALYSIS, ROUTINE W REFLEX MICROSCOPIC
BILIRUBIN URINE: NEGATIVE
Glucose, UA: NEGATIVE mg/dL
Hgb urine dipstick: NEGATIVE
Ketones, ur: NEGATIVE mg/dL
Leukocytes, UA: NEGATIVE
NITRITE: NEGATIVE
PH: 7 (ref 5.0–8.0)
Protein, ur: NEGATIVE mg/dL
SPECIFIC GRAVITY, URINE: 1.01 (ref 1.005–1.030)

## 2016-03-27 LAB — RAPID URINE DRUG SCREEN, HOSP PERFORMED
AMPHETAMINES: NOT DETECTED
BENZODIAZEPINES: NOT DETECTED
Barbiturates: NOT DETECTED
COCAINE: NOT DETECTED
Opiates: NOT DETECTED
Tetrahydrocannabinol: NOT DETECTED

## 2016-03-27 LAB — CBC WITH DIFFERENTIAL/PLATELET
BASOS ABS: 0.1 10*3/uL (ref 0.0–0.1)
Basophils Relative: 0 %
EOS ABS: 0.4 10*3/uL (ref 0.0–0.7)
Eosinophils Relative: 3 %
HCT: 37.4 % (ref 36.0–46.0)
HEMOGLOBIN: 12 g/dL (ref 12.0–15.0)
LYMPHS ABS: 3 10*3/uL (ref 0.7–4.0)
LYMPHS PCT: 22 %
MCH: 28.3 pg (ref 26.0–34.0)
MCHC: 32.1 g/dL (ref 30.0–36.0)
MCV: 88.2 fL (ref 78.0–100.0)
Monocytes Absolute: 0.6 10*3/uL (ref 0.1–1.0)
Monocytes Relative: 4 %
NEUTROS PCT: 71 %
Neutro Abs: 9.9 10*3/uL — ABNORMAL HIGH (ref 1.7–7.7)
Platelets: 312 10*3/uL (ref 150–400)
RBC: 4.24 MIL/uL (ref 3.87–5.11)
RDW: 15.7 % — ABNORMAL HIGH (ref 11.5–15.5)
WBC: 13.9 10*3/uL — AB (ref 4.0–10.5)

## 2016-03-27 LAB — COMPREHENSIVE METABOLIC PANEL
ALBUMIN: 4.1 g/dL (ref 3.5–5.0)
ALK PHOS: 73 U/L (ref 38–126)
ALT: 41 U/L (ref 14–54)
AST: 28 U/L (ref 15–41)
Anion gap: 7 (ref 5–15)
BUN: 13 mg/dL (ref 6–20)
CALCIUM: 9.6 mg/dL (ref 8.9–10.3)
CHLORIDE: 102 mmol/L (ref 101–111)
CO2: 30 mmol/L (ref 22–32)
CREATININE: 0.67 mg/dL (ref 0.44–1.00)
GFR calc Af Amer: >60 mL/min (ref >60)
GFR calc non Af Amer: >60 mL/min (ref >60)
GLUCOSE: 109 mg/dL — AB (ref 65–99)
Potassium: 3.4 mmol/L — ABNORMAL LOW (ref 3.5–5.1)
SODIUM: 139 mmol/L (ref 135–145)
Total Bilirubin: 0.5 mg/dL (ref 0.3–1.2)
Total Protein: 8.3 g/dL — ABNORMAL HIGH (ref 6.5–8.1)

## 2016-03-27 LAB — CBC
HEMATOCRIT: 36.3 % (ref 36.0–46.0)
Hemoglobin: 12.2 g/dL (ref 12.0–15.0)
MCH: 28.8 pg (ref 26.0–34.0)
MCHC: 33.6 g/dL (ref 30.0–36.0)
MCV: 85.6 fL (ref 78.0–100.0)
Platelets: 358 10*3/uL (ref 150–400)
RBC: 4.24 MIL/uL (ref 3.87–5.11)
RDW: 16.1 % — ABNORMAL HIGH (ref 11.5–15.5)
WBC: 12.2 10*3/uL — AB (ref 4.0–10.5)

## 2016-03-27 LAB — TSH: TSH: 2.608 u[IU]/mL (ref 0.350–4.500)

## 2016-03-27 LAB — I-STAT TROPONIN, ED: TROPONIN I, POC: 0.01 ng/mL (ref 0.00–0.08)

## 2016-03-27 LAB — I-STAT CG4 LACTIC ACID, ED: LACTIC ACID, VENOUS: 1.28 mmol/L (ref 0.5–1.9)

## 2016-03-27 LAB — AMMONIA: AMMONIA: 14 umol/L (ref 9–35)

## 2016-03-27 LAB — ETHANOL

## 2016-03-27 LAB — CBG MONITORING, ED: Glucose-Capillary: 104 mg/dL — ABNORMAL HIGH (ref 65–99)

## 2016-03-27 MED ORDER — SODIUM CHLORIDE 0.9 % IV BOLUS (SEPSIS)
1000.0000 mL | Freq: Once | INTRAVENOUS | Status: AC
  Administered 2016-03-27: 1000 mL via INTRAVENOUS

## 2016-03-27 NOTE — ED Triage Notes (Signed)
Pt from home with complaints of altered mental status that began about an hour ago. Pt has history of UTI recently. Pt also has hx of schizophrenia, but has been compliant with her meds and has no changes. Pt is alert to person, but is unaware of where she is, why she is here, or what the date is

## 2016-03-27 NOTE — ED Notes (Signed)
Bed: WA03 Expected date:  Expected time:  Means of arrival:  Comments: EMS 60yo F Confusion / recent UTI

## 2016-03-27 NOTE — ED Provider Notes (Signed)
[facility] DEPT Provider Note   CSN: [id number] Arrival date & time: 03/27/16  2102  By signing my name below, I, Irene Pap, attest that this documentation has been prepared under the direction and in the presence of Gloriann Loan, PA-C. Electronically Signed: Irene Pap, ED Scribe. 03/27/16. 11:43 PM.  History   Chief Complaint Chief Complaint  Patient presents with  . Altered Mental Status   The history is provided by the spouse. No language interpreter was used.   HPI Comments (Level 5 Caveat due to altered mental status): Taylor Ramirez is a 60 y.o. Female with a hx of DM, HTN, psychosis, recent UTI , and schizophrenia brought in by EMS who presents to the Emergency Department complaining of altered mental status onset one hour ago. She states that this began when she woke up. Pt states that she was at her house when she heard someone calling her name in her house, but states she could not see anything. Pt called her PCP who directed her to the ED. Pt states that she did not know where she was when she first got here. Pt recently finished a course of antibiotics for UTI but does not know what she took. She states that she has been taking it for a year. She states that she has a cough. Pt lives at home by herself. She denies use of drugs or alcohol. She denies pain anywhere.  Husband brought pt's medications and she is taking Flagyl. He states that she is not acting at baseline. She has been breaking glass in the house which began 5 days ago. He says that he noticed the confusion began after she started the antibiotics.    Past Medical History:  Diagnosis Date  . Allergic rhinitis   . Anemia    h/o  . Asthma    Pulmonary function Tests 09-2006: mild asthma  . Diabetes mellitus    able to control with diet and exercise  . Heart murmur    ECHO 5/10 was neg  . Hypertension   . Hypothyroidism   . 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 Vaginal discharge 03/2015  . Vaginal infection 03/2015    Patient Active Problem List   Diagnosis Date Noted  . PCP NOTES >>>>>> 07/23/2015  . Cardiac murmur 11/12/2014  . Near syncope 08/25/2014  . Abnormal LFTs 05/22/2014  . Decreased potassium in the blood 05/20/2014  . 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 DOE (dyspnea on exertion) 03/01/2013  . Back sprain 01/17/2012  . Foot pain 12/02/2011  . General medical examination 07/04/2011  . DJD (degenerative joint disease) 07/04/2011  . Hypothyroidism 11/28/2006  . DMII (diabetes mellitus, type 2) ([place]) 11/28/2006  . ANEMIA-NOS 11/28/2006  . HTN (hypertension) 11/28/2006  . ALLERGIC RHINITIS 11/28/2006  . ASTHMA 11/28/2006    Past Surgical History:  Procedure Laterality Date  . ABDOMINAL HYSTERECTOMY  2011   no oophorectomy  . TONSILLECTOMY      OB History    No data available       Home Medications    Prior to Admission medications   Medication Sig Start Date End Date Taking? Authorizing Provider  aspirin 81 MG tablet Take 81 mg by mouth daily.      Historical Provider, MD  estradiol (ESTRACE) 0.5 MG tablet Take 0.5 mg by mouth daily. 02/25/15   Historical Provider, MD  ketoconazole (NIZORAL) 2 % cream Apply 1 application topically 2 (two) times  daily. To affected area 03/27/15   Historical Provider, MD  potassium chloride (K-DUR) 10 MEQ tablet Take 1 tablet (10 mEq total) by mouth daily. 07/22/15   Colon Branch, MD  QUEtiapine (SEROQUEL) 50 MG tablet Take 150 mg by mouth at bedtime.    Historical Provider, MD  sertraline (ZOLOFT) 100 MG tablet Take 100 mg by mouth daily.    Historical Provider, MD  SYNTHROID 150 MCG tablet Take 1 tablet (150 mcg total) by mouth daily before breakfast. 03/07/16   Colon Branch, MD  valsartan-hydrochlorothiazide (DIOVAN-HCT) 160-12.5 MG tablet Take 1 tablet by mouth daily. 07/22/15   Colon Branch, MD    Family History Family History  Problem Relation Age of Onset  . Hypertension  Father   . Hypothyroidism Mother   . Graves' disease Daughter   . Diabetes Sister   . Heart attack Neg Hx   . Colon cancer Neg Hx   . Breast cancer Neg Hx     Social History Social History  Substance Use Topics  . Smoking status: Never Smoker  . Smokeless tobacco: Never Used  . Alcohol use Yes     Comment: socially     Allergies   Review of patient's allergies indicates no known allergies.   Review of Systems Review of Systems  Unable to perform ROS: Mental status change     Physical Exam Updated Vital Signs BP 147/76 (BP Location: Right Arm)   Pulse 88   Temp 98.6 F (37 C) (Oral)   Resp 18   SpO2 95%   Physical Exam  Constitutional: She appears well-developed and well-nourished.  Non-toxic appearance. She does not have a sickly appearance. She does not appear ill.  HENT:  Head: Normocephalic and atraumatic.  Mouth/Throat: Oropharynx is clear and moist.  Eyes: Conjunctivae are normal.  Neck: Normal range of motion. Neck supple.  Cardiovascular: Normal rate and regular rhythm.   Pulmonary/Chest: Effort normal and breath sounds normal. No accessory muscle usage or stridor. No respiratory distress. She has no wheezes. She has no rhonchi. She has no rales.  Abdominal: Soft. Bowel sounds are normal. She exhibits no distension. There is no tenderness.  Musculoskeletal: Normal range of motion.  Moves all extremities spontaneously.   Lymphadenopathy:    She has no cervical adenopathy.  Neurological: She is alert.  Speech clear without dysarthria. Tangential speech pattern. Pt is easily distracted  Skin: Skin is warm and dry.  Psychiatric: Her speech is tangential. She is actively hallucinating.  Nursing note and vitals reviewed.    ED Treatments / Results  DIAGNOSTIC STUDIES: Oxygen Saturation is 93% on RA, low by my interpretation.    COORDINATION OF CARE: 9:49 PM-labs   Labs (all labs ordered are listed, but only abnormal results are displayed) Labs  Reviewed  CBC - Abnormal; Notable for the following:       Result Value   WBC 12.2 (*)    RDW 16.1 (*)    All other components within normal limits  CBC WITH DIFFERENTIAL/PLATELET - Abnormal; Notable for the following:    WBC 13.9 (*)    RDW 15.7 (*)    Neutro Abs 9.9 (*)    All other components within normal limits  COMPREHENSIVE METABOLIC PANEL - Abnormal; Notable for the following:    Potassium 3.4 (*)    Glucose, Bld 109 (*)    Total Protein 8.3 (*)    All other components within normal limits  CBG MONITORING, ED - Abnormal; Notable  for the following:    Glucose-Capillary 104 (*)    All other components within normal limits  URINE CULTURE  ETHANOL  URINE RAPID DRUG SCREEN, HOSP PERFORMED  TSH  URINALYSIS, ROUTINE W REFLEX MICROSCOPIC (NOT AT [facility])  AMMONIA  I-STAT CG4 LACTIC ACID, ED  Randolm Idol, ED    EKG  EKG Interpretation None       Radiology Dg Chest 2 View  Result Date: 03/27/2016 CLINICAL DATA:  Altered mental status.  Asthma.  Hypertension. EXAM: CHEST  2 VIEW COMPARISON:  Chest radiograph 05/15/2014 FINDINGS: Cardiomediastinal contours are normal. No pneumothorax or sizable pleural effusion. No focal airspace consolidation or pulmonary edema. IMPRESSION: Clear lungs. Electronically Signed   By: Ulyses Jarred M.D.   On: 03/27/2016 22:11   Ct Head Wo Contrast  Result Date: 03/27/2016 CLINICAL DATA:  Altered mental status EXAM: CT HEAD WITHOUT CONTRAST TECHNIQUE: Contiguous axial images were obtained from the base of the skull through the vertex without intravenous contrast. COMPARISON:  05/15/2014 FINDINGS: Brain: Negative. No evidence of acute infarction, hemorrhage, hydrocephalus, extra-axial collection or mass lesion/mass effect. Vascular: No hyperdense vessel Skull: Negative Sinuses/Orbits: Negative IMPRESSION: Stable and negative head CT. Electronically Signed   By: Monte Fantasia M.D.   On: 03/27/2016 22:18    Procedures Procedures (including  critical care time)  Medications Ordered in ED Medications  sodium chloride 0.9 % bolus 1,000 mL (0 mLs Intravenous Stopped 03/28/16 0213)     Initial Impression / Assessment and Plan / ED Course  I have reviewed the triage vital signs and the nursing notes.  Pertinent labs & imaging results that were available during my care of the patient were reviewed by me and considered in my medical decision making (see chart for details).  Clinical Course   Patient presents with AMS since this afternoon. Unable to obtain history from patient due to tangential speech and possible hallucinations. Supposed to be on Seroquel, but not taking. Husband states that she has been taking Flagyl for a UTI. Patient denies any pain or other complaints. Lab work shows mild leukocytosis of 13.9, otherwise unremarkable. UA appears noninfectious. No metabolic derangements. TSH normal. Normal ammonia. CT head unremarkable. EKG nonacute. Suspect this is psychosis. TTS evaluated the patient, and patient will be seen by psychiatry in the morning for further evaluation and disposition.   Case has been discussed with and seen by Dr. Leonette [facility] who agrees with the above plan.   Final Clinical Impressions(s) / ED Diagnoses   Final diagnoses:  Altered mental status, unspecified altered mental status type    New Prescriptions New Prescriptions   No medications on file   I personally performed the services described in this documentation, which was scribed in my presence. The recorded information has been reviewed and is accurate.    Gloriann Loan, PA-C 03/28/16 [phone]

## 2016-03-28 DIAGNOSIS — F209 Schizophrenia, unspecified: Secondary | ICD-10-CM

## 2016-03-28 DIAGNOSIS — R4182 Altered mental status, unspecified: Secondary | ICD-10-CM | POA: Insufficient documentation

## 2016-03-28 MED ORDER — ZIPRASIDONE MESYLATE 20 MG IM SOLR
10.0000 mg | Freq: Once | INTRAMUSCULAR | Status: AC
  Administered 2016-03-28: 10 mg via INTRAMUSCULAR
  Filled 2016-03-28: qty 20

## 2016-03-28 MED ORDER — DIPHENHYDRAMINE HCL 50 MG/ML IJ SOLN
50.0000 mg | Freq: Once | INTRAMUSCULAR | Status: AC
  Administered 2016-03-28: 50 mg via INTRAMUSCULAR
  Filled 2016-03-28: qty 1

## 2016-03-28 MED ORDER — LORAZEPAM 1 MG PO TABS
1.0000 mg | ORAL_TABLET | Freq: Once | ORAL | Status: AC
  Administered 2016-03-28: 1 mg via ORAL
  Filled 2016-03-28: qty 1

## 2016-03-28 MED ORDER — LORAZEPAM 2 MG/ML IJ SOLN
2.0000 mg | Freq: Once | INTRAMUSCULAR | Status: AC
  Administered 2016-03-28: 2 mg via INTRAMUSCULAR
  Filled 2016-03-28: qty 1

## 2016-03-28 MED ORDER — STERILE WATER FOR INJECTION IJ SOLN
INTRAMUSCULAR | Status: AC
Start: 2016-03-28 — End: 2016-03-28
  Administered 2016-03-28: 10 mL
  Filled 2016-03-28: qty 10

## 2016-03-28 NOTE — Progress Notes (Signed)
Date: 03/28/16 Time: 1400 Location: [facility]  Group Topic: Wellness  Goal Area(s) Addresses:  Patient will define components of whole wellness. Patient will verbalize benefit of whole wellness.  Intervention: [employer]  Activity: Keep It Chartered certified accountant.  Patients tossed a beach ball back and forth as many times as possible without the ball staying on he ground.  LRT kept count of how many times the ball was passed between the patients.  The ball could hit the ground but could not stay on the ground for longer than 3 seconds.  If the ball stayed on the ground for longer than 3 seconds, the count would start over.   Education: Wellness, Dentist.   Education Outcome: Acknowledges education/In group clarification offered/Needs additional education.   Clinical Observations/Feedback: Pt did not attend group.    Victorino Sparrow, LRT/CTRS

## 2016-03-28 NOTE — ED Notes (Signed)
Husband's Geoffery Spruce) phone number [phone]

## 2016-03-28 NOTE — ED Provider Notes (Signed)
Medical screening examination/treatment/procedure(s) were conducted as a shared visit with non-physician practitioner(s) and myself.  I personally evaluated the patient during the encounter. Briefly, the patient is a 60 y.o. female with a history of hypertension, hypothyroidism, diabetes, schizophrenia presents the ED with 5 days of worsening confusion per the husband. Level V caveat due to altered mental status. Patient was recently being treated for urinary tract infection. CT head unremarkable. Labs without evidence of acute organic process that would be contributing to the patient's mental status. This may be related to patient schizophrenia. The husband reports that the patient is not taking Seroquel which is noted in her med list. Would like to have psychiatry evaluate the patient. Currently pending their assessment.   Patient care turned over to Dr Randal Buba at 0100. Patient case and results discussed in detail; please see their note for further ED managment.      Fatima Blank, MD 03/30/16 [phone]

## 2016-03-28 NOTE — [facility] Suicide Risk Assessment (Deleted)
Suicide Risk Assessment  Discharge Assessment   [facility] Discharge Suicide Risk Assessment   Principal Problem: Schizophrenia, unspecified [facility]) Discharge Diagnoses:  Patient Active Problem List   Diagnosis Date Noted  . Schizophrenia, unspecified ([place]) [F20.9] 05/16/2014    Priority: High  . Altered mental status [R41.82]   . PCP NOTES >>>>>> [Z09] 07/23/2015  . Cardiac murmur [R01.1] 11/12/2014  . Near syncope [R55] 08/25/2014  . Abnormal LFTs [R79.89] 05/22/2014  . Decreased potassium in the blood [E87.6] 05/20/2014  . DOE (dyspnea on exertion) [R06.09] 03/01/2013  . Back sprain [S23.9XXA] 01/17/2012  . Foot pain [M79.673] 12/02/2011  . General medical examination [V70] 07/04/2011  . DJD (degenerative joint disease) [M19.90] 07/04/2011  . Hypothyroidism [E03.9] 11/28/2006  . DMII (diabetes mellitus, type 2) ([place]) [E11.9] 11/28/2006  . ANEMIA-NOS [D64.9] 11/28/2006  . HTN (hypertension) [I10] 11/28/2006  . ALLERGIC RHINITIS [J30.9] 11/28/2006  . ASTHMA [J45.909] 11/28/2006    Total Time spent with patient: 45 minutes  Musculoskeletal: Strength & Muscle Tone: within normal limits Gait & Station: normal Patient leans: N/A  Psychiatric Specialty Exam: Physical Exam  Constitutional: She is oriented to person, place, and time. She appears well-developed and well-nourished.  HENT:  Head: Normocephalic.  Neck: Normal range of motion.  Respiratory: Effort normal.  Musculoskeletal: Normal range of motion.  Neurological: She is alert and oriented to person, place, and time.  Skin: Skin is warm and dry.  Psychiatric: She has a normal mood and affect. Her speech is normal and behavior is normal. Judgment and thought content normal. Cognition and memory are normal.    Review of Systems  Constitutional: Negative.   HENT: Negative.   Eyes: Negative.   Respiratory: Negative.   Cardiovascular: Negative.   Gastrointestinal: Negative.   Genitourinary: Negative.   Musculoskeletal:  Negative.   Skin: Negative.   Neurological: Negative.   Endo/Heme/Allergies: Negative.   Psychiatric/Behavioral: Negative.     Blood pressure 163/87, pulse 99, temperature 98.6 F (37 C), temperature source Oral, resp. rate 16, SpO2 98 %.There is no height or weight on file to calculate BMI.  General Appearance: Casual  Eye Contact:  Good  Speech:  Normal Rate  Volume:  Normal  Mood:  Euthymic  Affect:  Congruent  Thought Process:  Coherent and Descriptions of Associations: Intact  Orientation:  Full (Time, Place, and Person)  Thought Content:  WDL  Suicidal Thoughts:  No  Homicidal Thoughts:  No  Memory:  Immediate;   Good Recent;   Good Remote;   Good  Judgement:  Good  Insight:  Good  Psychomotor Activity:  Normal  Concentration:  Concentration: Good and Attention Span: Good  Recall:  Good  Fund of Knowledge:  Good  Language:  Good  Akathisia:  No  Handed:  Right  AIMS (if indicated):     Assets:  Housing Intimacy Leisure Time Physical Health Resilience Social Support  ADL's:  Intact  Cognition:  WNL  Sleep:       Mental Status Per Nursing Assessment::   On Admission:   AMS  Demographic Factors:  NA  Loss Factors: NA  Historical Factors: NA  Risk Reduction Factors:   Sense of responsibility to family, Living with another person, especially a relative, Positive social support and Positive therapeutic relationship  Continued Clinical Symptoms:  None  Cognitive Features That Contribute To Risk:  None    Suicide Risk:  Mild:  Suicidal ideation of limited frequency, intensity, duration, and specificity.  There are no identifiable plans, no  associated intent, mild dysphoria and related symptoms, good self-control (both objective and subjective assessment), few other risk factors, and identifiable protective factors, including available and accessible social support.    Plan Of Care/Follow-up recommendations:  Activity:  as tolerated Diet:  heart  healthy diet  Aviana Shevlin, NP 03/28/2016, 12:58 PM

## 2016-03-28 NOTE — ED Notes (Signed)
Pt continues to walk to bathroom 1 3-5 minutes

## 2016-03-28 NOTE — ED Notes (Signed)
Pt's husband would like to be called after pt is evaluated by a doctor. Pt consents to this.

## 2016-03-28 NOTE — ED Notes (Signed)
Pt given medications IM per orders (see chart) and pt allowed them to be given. Tolerated well and got into bed anticipating relief from her depressive and delusional symptoms.

## 2016-03-28 NOTE — BH Assessment (Signed)
Assessment completed. Consulted Serena Colonel, NP who recommended that pt be re-evaluated in the morning. Informed Kayla Rose,PA-C of the recommendation.

## 2016-03-28 NOTE — BH Assessment (Addendum)
Tele Assessment Note   Taylor Ramirez is an 60 y.o. female presenting to [facility] due to an altered mental status. Pt denies SI and HI but reported seeing her grandson "Malachi" on the ceiling when she was at home. Pt's thought process is tangential and is difficult to follow. Pt reported that she is unsure if she took her medication for her blood sugar today and shared that her husband gives her the medication daily.  Pt stated "I have to go to the doctor tomorrow and I will call 911 after seeing all this". "I have been on the job for 20 years and they keep calling me to the office for vacation". "I see my grandbaby on the ceiling". "I hope I don't see him up there". "The doctor told me to take 10 of them  but I don't need it". "I keep hearing things falling and jumping in the corner". PT is oriented to name and place. It is recommended that pt be evaluated in the morning.   Diagnosis: Brief Psychotic Disorder   Past Medical History:  Past Medical History:  Diagnosis Date  . Allergic rhinitis   . Anemia    h/o  . Asthma    Pulmonary function Tests 09-2006: mild asthma  . Diabetes mellitus    able to control with diet and exercise  . Heart murmur    ECHO 5/10 was neg  . Hypertension   . Hypothyroidism   . 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 Vaginal discharge 03/2015  . Vaginal infection 03/2015    Past Surgical History:  Procedure Laterality Date  . ABDOMINAL HYSTERECTOMY  2011   no oophorectomy  . TONSILLECTOMY      Family History:  Family History  Problem Relation Age of Onset  . Hypertension Father   . Hypothyroidism Mother   . Graves' disease Daughter   . Diabetes Sister   . Heart attack Neg Hx   . Colon cancer Neg Hx   . Breast cancer Neg Hx     Social History:  reports that she has never smoked. She has never used smokeless tobacco. She reports that she drinks alcohol. She reports that she does not use  drugs.  Additional Social History:  Alcohol / Drug Use History of alcohol / drug use?: No history of alcohol / drug abuse  CIWA: CIWA-Ar BP: 143/74 Pulse Rate: 82 COWS:    PATIENT STRENGTHS: (choose at least two) Average or above average intelligence Supportive family/friends  Allergies: No Known Allergies  Home Medications:  (Not in a hospital admission)  OB/GYN Status:  No LMP recorded. Patient has had a hysterectomy.  General Assessment Data Location of Assessment: [facility] ED TTS Assessment: In system Is this a Tele or Face-to-Face Assessment?: Face-to-Face Is this an Initial Assessment or a Re-assessment for this encounter?: Initial Assessment Marital status: Married Living Arrangements: Spouse/significant other Can pt return to current living arrangement?: Yes Admission Status: Voluntary Is patient capable of signing voluntary admission?: Yes Referral Source: Self/Family/Friend Insurance type: None      Crisis Care Plan Living Arrangements: Spouse/significant other Name of Psychiatrist: No provider reported Name of Therapist: No provider reported   Education Status Is patient currently in school?: No  Risk to self with the past 6 months Suicidal Ideation: No Has patient been a risk to self within the past 6 months prior to admission? : No Suicidal Intent: No Has patient had any suicidal intent  within the past 6 months prior to admission? : No Is patient at risk for suicide?: No Suicidal Plan?: No Has patient had any suicidal plan within the past 6 months prior to admission? : No Access to Means: No What has been your use of drugs/alcohol within the last 12 months?: No drugs or alcohol use reported.  Previous Attempts/Gestures: No How many times?: 0 Other Self Harm Risks: Denies  Triggers for Past Attempts: None known Intentional Self Injurious Behavior: None Family Suicide History: No Recent stressful life event(s):  (Pt denies ) Persecutory  voices/beliefs?: No Depression:  (unable to assess) Depression Symptoms:  (unable to assess ) Substance abuse history and/or treatment for substance abuse?: No Suicide prevention information given to non-admitted patients: Not applicable  Risk to Others within the past 6 months Homicidal Ideation: No Does patient have any lifetime risk of violence toward others beyond the six months prior to admission? : No Thoughts of Harm to Others: No Current Homicidal Intent: No Current Homicidal Plan: No Access to Homicidal Means: No Identified Victim: N/A History of harm to others?: No Assessment of Violence: None Noted Violent Behavior Description: No violent behaviors reported. Does patient have access to weapons?: No Criminal Charges Pending?: No Does patient have a court date: No Is patient on probation?: No  Psychosis Hallucinations: Visual Delusions: None noted  Mental Status Report Appearance/Hygiene: In hospital gown Eye Contact: Good Motor Activity: Freedom of movement Speech: Tangential Level of Consciousness: Quiet/awake Mood: Pleasant Affect: Appropriate to circumstance Anxiety Level: Minimal Thought Processes: Tangential, Flight of Ideas Judgement: Partial Orientation: Place Obsessive Compulsive Thoughts/Behaviors: None  Cognitive Functioning Concentration: Decreased Memory: Recent Intact, Remote Intact IQ: Average Insight: Poor Impulse Control: Good Appetite: Good Weight Loss: 0 Weight Gain: 0 Sleep: Unable to Assess Vegetative Symptoms: Unable to Assess  ADLScreening [facility]) Patient's cognitive ability adequate to safely complete daily activities?: Yes Patient able to express need for assistance with ADLs?: Yes Independently performs ADLs?: Yes (appropriate for developmental age)  Prior Inpatient Therapy Prior Inpatient Therapy: No  Prior Outpatient Therapy Prior Outpatient Therapy: No Does patient have an ACCT team?: No Does  patient have Intensive In-House Services?  : No Does patient have [facility] services? : No Does patient have P4CC services?: No  ADL Screening (condition at time of admission) Patient's cognitive ability adequate to safely complete daily activities?: Yes Is the patient deaf or have difficulty hearing?: No Does the patient have difficulty seeing, even when wearing glasses/contacts?: No Does the patient have difficulty concentrating, remembering, or making decisions?: No Patient able to express need for assistance with ADLs?: Yes Independently performs ADLs?: Yes (appropriate for developmental age)       Abuse/Neglect Assessment (Assessment to be complete while patient is alone) Physical Abuse: Denies Verbal Abuse: Denies Sexual Abuse:  (Pt is unsure. ) Exploitation of patient/patient's resources: Denies Self-Neglect: Denies     Regulatory affairs officer (For Healthcare) Does patient have an advance directive?:  (unable to assess )    Additional Information 1:1 In Past 12 Months?: No CIRT Risk: No Elopement Risk: No Does patient have medical clearance?: Yes     Disposition:  Disposition Initial Assessment Completed for this Encounter: Yes Disposition of Patient: Other dispositions Other disposition(s): Other (Comment) (AM Psych eval )  Raynisha Avilla S 03/28/2016 1:10 AM

## 2016-03-28 NOTE — ED Notes (Signed)
On assessment in the [facility] pt is tearful, tangential and delusional. She thinks that she killed a grandchild, although she did not do anything, but is relieved to know that she did not.

## 2016-03-28 NOTE — ED Notes (Signed)
Pt walking to and from the bathroom q 3-5 minutes.

## 2016-03-28 NOTE — ED Notes (Signed)
Pt has been tearful and not consolable because of her thoughts that she hurt her grandchild. She knows that she did not, but her mind will not rest. Asked pt if she would like medication and accept an injection, and she said that she would.

## 2016-03-28 NOTE — ED Notes (Signed)
IVC papers served by [facility]; Pt continues to be sedated. Able to arouse and eyes open, but goes back to sleep. [facility] and reported that pt will likely be coming tomorrow after 7am.

## 2016-03-28 NOTE — Consult Note (Signed)
[place] Psychiatry Consult   Reason for Consult:  AMS Referring Physician:  EDP Patient Identification: Taylor Ramirez MRN:  [id number] Principal Diagnosis: Schizophrenia, unspecified [facility]) Diagnosis:   Patient Active Problem List   Diagnosis Date Noted  . Schizophrenia, unspecified ([place]) [F20.9] 05/16/2014    Priority: High  . PCP NOTES >>>>>> [Z09] 07/23/2015  . Cardiac murmur [R01.1] 11/12/2014  . Near syncope [R55] 08/25/2014  . Abnormal LFTs [R79.89] 05/22/2014  . Decreased potassium in the blood [E87.6] 05/20/2014  . DOE (dyspnea on exertion) [R06.09] 03/01/2013  . Back sprain [S23.9XXA] 01/17/2012  . Foot pain [M79.673] 12/02/2011  . General medical examination [V70] 07/04/2011  . DJD (degenerative joint disease) [M19.90] 07/04/2011  . Hypothyroidism [E03.9] 11/28/2006  . DMII (diabetes mellitus, type 2) ([place]) [E11.9] 11/28/2006  . ANEMIA-NOS [D64.9] 11/28/2006  . HTN (hypertension) [I10] 11/28/2006  . ALLERGIC RHINITIS [J30.9] 11/28/2006  . ASTHMA [J45.909] 11/28/2006    Total Time spent with patient: 45 minutes  Subjective:   Taylor Ramirez is a 60 y.o. female patient does not warrant admission.  HPI:  60 yo female who presented to the ED for confusion and altered mental status, history of recent UTIs.  On assessment, she was alert and oriented to time, person, and place.  Denied suicidal/homicidal ideations, hallucinations, and alcohol/drug abuse.  Reports compliance with medications.  Patient was going to be discharge but then started saying she killed her grandchild and started crying uncontrollably.  She has convinced herself she has killed the child despite not being true.  Taylor Ramirez will need geropsychiatry.  Past Psychiatric History: schizophrenia  Risk to Self: Suicidal Ideation: No Suicidal Intent: No Is patient at risk for suicide?: No Suicidal Plan?: No Access to Means: No What has been your use of drugs/alcohol within the last 12  months?: No drugs or alcohol use reported.  How many times?: 0 Other Self Harm Risks: Denies  Triggers for Past Attempts: None known Intentional Self Injurious Behavior: None Risk to Others: Homicidal Ideation: No Thoughts of Harm to Others: No Current Homicidal Intent: No Current Homicidal Plan: No Access to Homicidal Means: No Identified Victim: N/A History of harm to others?: No Assessment of Violence: None Noted Violent Behavior Description: No violent behaviors reported. Does patient have access to weapons?: No Criminal Charges Pending?: No Does patient have a court date: No Prior Inpatient Therapy: Prior Inpatient Therapy: No Prior Outpatient Therapy: Prior Outpatient Therapy: No Does patient have an ACCT team?: No Does patient have Intensive In-House Services?  : No Does patient have [facility] services? : No Does patient have P4CC services?: No  Past Medical History:  Past Medical History:  Diagnosis Date  . Allergic rhinitis   . Anemia    h/o  . Asthma    Pulmonary function Tests 09-2006: mild asthma  . Diabetes mellitus    able to control with diet and exercise  . Heart murmur    ECHO 5/10 was neg  . Hypertension   . Hypothyroidism   . 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 Vaginal discharge 03/2015  . Vaginal infection 03/2015    Past Surgical History:  Procedure Laterality Date  . ABDOMINAL HYSTERECTOMY  2011   no oophorectomy  . TONSILLECTOMY     Family History:  Family History  Problem Relation Age of Onset  . Hypertension Father   . Hypothyroidism Mother   . Graves' disease Daughter   . Diabetes Sister   .  Heart attack Neg Hx   . Colon cancer Neg Hx   . Breast cancer Neg Hx    Family Psychiatric  History: none Social History:  History  Alcohol Use  . Yes    Comment: socially     History  Drug Use No    Social History   Social History  . Marital status: Married    Spouse name: N/A  .  Number of children: 1  . Years of education: N/A   Occupational History  . not working-retired    Social History Main Topics  . Smoking status: Never Smoker  . Smokeless tobacco: Never Used  . Alcohol use Yes     Comment: socially  . Drug use: No  . Sexual activity: Not Asked   Other Topics Concern  . None   Social History Narrative   Lives w/ husband   Additional Social History:    Allergies:  No Known Allergies  Labs:  Results for orders placed or performed during the hospital encounter of 03/27/16 (from the past 48 hour(s))  CBG monitoring, ED     Status: Abnormal   Collection Time: 03/27/16  9:28 PM  Result Value Ref Range   Glucose-Capillary 104 (H) 65 - 99 mg/dL  CBC     Status: Abnormal   Collection Time: 03/27/16  9:33 PM  Result Value Ref Range   WBC 12.2 (H) 4.0 - 10.5 K/uL   RBC 4.24 3.87 - 5.11 MIL/uL   Hemoglobin 12.2 12.0 - 15.0 g/dL   HCT 36.3 36.0 - 46.0 %   MCV 85.6 78.0 - 100.0 fL   MCH 28.8 26.0 - 34.0 pg   MCHC 33.6 30.0 - 36.0 g/dL   RDW 16.1 (H) 11.5 - 15.5 %   Platelets 358 150 - 400 K/uL  I-Stat CG4 Lactic Acid, ED     Status: None   Collection Time: 03/27/16  9:39 PM  Result Value Ref Range   Lactic Acid, Venous 1.28 0.5 - 1.9 mmol/L  Urine rapid drug screen (hosp performed)not at [facility]     Status: None   Collection Time: 03/27/16 10:00 PM  Result Value Ref Range   Opiates NONE DETECTED NONE DETECTED   Cocaine NONE DETECTED NONE DETECTED   Benzodiazepines NONE DETECTED NONE DETECTED   Amphetamines NONE DETECTED NONE DETECTED   Tetrahydrocannabinol NONE DETECTED NONE DETECTED   Barbiturates NONE DETECTED NONE DETECTED    Comment:        DRUG SCREEN FOR MEDICAL PURPOSES ONLY.  IF CONFIRMATION IS NEEDED FOR ANY PURPOSE, NOTIFY LAB WITHIN 5 DAYS.        LOWEST DETECTABLE LIMITS FOR URINE DRUG SCREEN Drug Class       Cutoff (ng/mL) Amphetamine      1000 Barbiturate      200 Benzodiazepine   283 Tricyclics       151 Opiates           300 Cocaine          300 THC              50   Urinalysis, Routine w reflex microscopic (not at [facility])     Status: None   Collection Time: 03/27/16 10:00 PM  Result Value Ref Range   Color, Urine YELLOW YELLOW   APPearance CLEAR CLEAR   Specific Gravity, Urine 1.010 1.005 - 1.030   pH 7.0 5.0 - 8.0   Glucose, UA NEGATIVE NEGATIVE mg/dL   Hgb urine dipstick NEGATIVE  NEGATIVE   Bilirubin Urine NEGATIVE NEGATIVE   Ketones, ur NEGATIVE NEGATIVE mg/dL   Protein, ur NEGATIVE NEGATIVE mg/dL   Nitrite NEGATIVE NEGATIVE   Leukocytes, UA NEGATIVE NEGATIVE    Comment: MICROSCOPIC NOT DONE ON URINES WITH NEGATIVE PROTEIN, BLOOD, LEUKOCYTES, NITRITE, OR GLUCOSE <1000 mg/dL.  CBC WITH DIFFERENTIAL     Status: Abnormal   Collection Time: 03/27/16 10:30 PM  Result Value Ref Range   WBC 13.9 (H) 4.0 - 10.5 K/uL   RBC 4.24 3.87 - 5.11 MIL/uL   Hemoglobin 12.0 12.0 - 15.0 g/dL   HCT 44.9 67.5 - 91.6 %   MCV 88.2 78.0 - 100.0 fL   MCH 28.3 26.0 - 34.0 pg   MCHC 32.1 30.0 - 36.0 g/dL   RDW 38.4 (H) 66.5 - 99.3 %   Platelets 312 150 - 400 K/uL   Neutrophils Relative % 71 %   Neutro Abs 9.9 (H) 1.7 - 7.7 K/uL   Lymphocytes Relative 22 %   Lymphs Abs 3.0 0.7 - 4.0 K/uL   Monocytes Relative 4 %   Monocytes Absolute 0.6 0.1 - 1.0 K/uL   Eosinophils Relative 3 %   Eosinophils Absolute 0.4 0.0 - 0.7 K/uL   Basophils Relative 0 %   Basophils Absolute 0.1 0.0 - 0.1 K/uL  Ethanol     Status: None   Collection Time: 03/27/16 10:31 PM  Result Value Ref Range   Alcohol, Ethyl (B) <5 <5 mg/dL    Comment:        LOWEST DETECTABLE LIMIT FOR SERUM ALCOHOL IS 5 mg/dL FOR MEDICAL PURPOSES ONLY   TSH     Status: None   Collection Time: 03/27/16 10:31 PM  Result Value Ref Range   TSH 2.608 0.350 - 4.500 uIU/mL  Ammonia     Status: None   Collection Time: 03/27/16 10:31 PM  Result Value Ref Range   Ammonia 14 9 - 35 umol/L  Comprehensive metabolic panel     Status: Abnormal   Collection Time:  03/27/16 10:31 PM  Result Value Ref Range   Sodium 139 135 - 145 mmol/L   Potassium 3.4 (L) 3.5 - 5.1 mmol/L   Chloride 102 101 - 111 mmol/L   CO2 30 22 - 32 mmol/L   Glucose, Bld 109 (H) 65 - 99 mg/dL   BUN 13 6 - 20 mg/dL   Creatinine, Ser 5.70 0.44 - 1.00 mg/dL   Calcium 9.6 8.9 - 17.7 mg/dL   Total Protein 8.3 (H) 6.5 - 8.1 g/dL   Albumin 4.1 3.5 - 5.0 g/dL   AST 28 15 - 41 U/L   ALT 41 14 - 54 U/L   Alkaline Phosphatase 73 38 - 126 U/L   Total Bilirubin 0.5 0.3 - 1.2 mg/dL   GFR calc non Af Amer >60 >60 mL/min   GFR calc Af Amer >60 >60 mL/min    Comment: (NOTE) The eGFR has been calculated using the CKD EPI equation. This calculation has not been validated in all clinical situations. eGFR's persistently <60 mL/min signify possible Chronic Kidney Disease.    Anion gap 7 5 - 15  I-Stat Troponin, ED (not at [facility])     Status: None   Collection Time: 03/27/16 10:37 PM  Result Value Ref Range   Troponin i, poc 0.01 0.00 - 0.08 ng/mL   Comment 3            Comment: Due to the release kinetics of cTnI, a negative result within  the first hours of the onset of symptoms does not rule out myocardial infarction with certainty. If myocardial infarction is still suspected, repeat the test at appropriate intervals.     No current facility-administered medications for this encounter.    Current Outpatient Prescriptions  Medication Sig Dispense Refill  . aspirin 81 MG tablet Take 81 mg by mouth daily.      Marland Kitchen estradiol (ESTRACE) 0.5 MG tablet Take 0.5 mg by mouth daily.  11  . ketoconazole (NIZORAL) 2 % cream Apply 1 application topically 2 (two) times daily. To affected area  0  . potassium chloride (K-DUR) 10 MEQ tablet Take 1 tablet (10 mEq total) by mouth daily. 30 tablet 6  . QUEtiapine (SEROQUEL) 50 MG tablet Take 150 mg by mouth at bedtime.    . sertraline (ZOLOFT) 100 MG tablet Take 100 mg by mouth daily.    Marland Kitchen SYNTHROID 150 MCG tablet Take 1 tablet (150 mcg total) by mouth  daily before breakfast. 30 tablet 0  . valsartan-hydrochlorothiazide (DIOVAN-HCT) 160-12.5 MG tablet Take 1 tablet by mouth daily. 30 tablet 6    Musculoskeletal: Strength & Muscle Tone: within normal limits Gait & Station: normal Patient leans: N/A  Psychiatric Specialty Exam: Physical Exam  Constitutional: She is oriented to person, place, and time. She appears well-developed and well-nourished.  HENT:  Head: Normocephalic.  Neck: Normal range of motion.  Respiratory: Effort normal.  Musculoskeletal: Normal range of motion.  Neurological: She is alert and oriented to person, place, and time.  Skin: Skin is warm and dry.  Psychiatric: Her speech is normal and behavior is normal. Judgment normal. Thought content is delusional. Cognition and memory are normal. She exhibits a depressed mood.    Review of Systems  Constitutional: Negative.   HENT: Negative.   Eyes: Negative.   Respiratory: Negative.   Cardiovascular: Negative.   Gastrointestinal: Negative.   Genitourinary: Negative.   Musculoskeletal: Negative.   Skin: Negative.   Neurological: Negative.   Endo/Heme/Allergies: Negative.   Psychiatric/Behavioral: Positive for depression.    Blood pressure 163/87, pulse 99, temperature 98.6 F (37 C), temperature source Oral, resp. rate 16, SpO2 98 %.There is no height or weight on file to calculate BMI.  General Appearance: Casual  Eye Contact:  Good  Speech:  Normal Rate  Volume:  Normal  Mood:  Depressed   Affect:  Congruent  Thought Process:  Coherent and Descriptions of Associations: Intact  Orientation:  Full (Time, Place, and Person)  Thought Content:  Delusional  Suicidal Thoughts:  No  Homicidal Thoughts:  No  Memory:  Immediate;   Good Recent;   Good Remote;   Good  Judgement:  Impaired  Insight:  Fair  Psychomotor Activity:  Normal  Concentration:  Concentration: Good and Attention Span: Good  Recall:  [place] of Knowledge:  Good  Language:  Good   Akathisia:  No  Handed:  Right  AIMS (if indicated):     Assets:  Housing Intimacy Leisure Time Physical Health Resilience Social Support  ADL's:  Intact  Cognition:  WNL  Sleep:        Treatment Plan Summary: Daily contact with patient to assess and evaluate symptoms and progress in treatment, Medication management and Plan schizophrenia, unspecified:  -Crisis stabilization -Medication management: continue medical medications along with her Seroquel 150 mg at bedtime for psychosis and Zoloft 100 mg daily for depression -Individual counseling  Disposition: No evidence of imminent risk to self or others at present.  Waylan Boga, NP 03/28/2016 10:45 AM  Patient seen face-to-face for psychiatric evaluation, chart reviewed and case discussed with the physician extender and developed treatment plan. Reviewed the information documented and agree with the treatment plan. Corena Pilgrim, MD

## 2016-03-28 NOTE — ED Notes (Signed)
Pt transported to [facility] by [employer]. She is alert and oriented. All belongings returned to pt who signed for same. Report called to Nurse, children's at [facility].

## 2016-03-28 NOTE — BH Assessment (Signed)
[place] Assessment Progress Note  Per Waylan Boga, DNP, this pt requires psychiatric hospitalization at this time.  At 14:49 Pamala Hurry calls from [facility].  Pt has been accepted to their facility by Dr Launa Grill to the [place] [facility], Rm 141.  Theodoro Clock concurs with this decision, but believes that pt cannot be relied upon to sign consent for admission to this facility, and further, that she meets criteria for IVC.  EDP Duffy Bruce, MD, concurs with these decisions, and has initiated IVC.  IVC documents have been faxed to [facility], and at 15:56 Lattie Corns confirms receipt.  As of this writing, service of Findings and Custody Order is pending.  Pt is to be transported via [facility].  These results have been reported to pt's nurse, Diane, who agrees to call report to [phone].  Pt must arrive at the facility before 23:00 tonight, or after 06:00 tomorrow morning.  Jalene Mullet, [place] Triage Specialist [phone]

## 2016-03-28 NOTE — ED Notes (Signed)
Frequent urination, patient up to restroom every 5-7 minutes with minimal amounts of urine.

## 2016-03-28 NOTE — ED Notes (Signed)
Bed: [facility] Expected date:  Expected time:  Means of arrival:  Comments: For ED 3

## 2016-03-29 LAB — URINE CULTURE

## 2016-04-21 ENCOUNTER — Telehealth: Payer: Self-pay | Admitting: Internal Medicine

## 2016-04-21 NOTE — Telephone Encounter (Signed)
I am fine with taking her on

## 2016-04-21 NOTE — Telephone Encounter (Signed)
See below

## 2016-04-21 NOTE — Telephone Encounter (Signed)
° °  Pt is a pt of Dr Larose Kells and lives in [place] and is asking if she can transfer to this office. Will either one of you accept her as a transfer. Has no insurance

## 2016-04-22 ENCOUNTER — Ambulatory Visit: Payer: Self-pay | Admitting: Internal Medicine

## 2016-04-22 NOTE — Telephone Encounter (Signed)
Pt has been scheduled.  °

## 2016-05-04 ENCOUNTER — Encounter: Payer: Self-pay | Admitting: Adult Health

## 2016-05-04 ENCOUNTER — Ambulatory Visit (INDEPENDENT_AMBULATORY_CARE_PROVIDER_SITE_OTHER): Payer: Self-pay | Admitting: Adult Health

## 2016-05-04 VITALS — BP 152/80 | Temp 98.2°F | Ht 62.0 in | Wt 292.7 lb

## 2016-05-04 DIAGNOSIS — E038 Other specified hypothyroidism: Secondary | ICD-10-CM

## 2016-05-04 DIAGNOSIS — Z7189 Other specified counseling: Secondary | ICD-10-CM

## 2016-05-04 DIAGNOSIS — Z23 Encounter for immunization: Secondary | ICD-10-CM

## 2016-05-04 DIAGNOSIS — Z760 Encounter for issue of repeat prescription: Secondary | ICD-10-CM

## 2016-05-04 DIAGNOSIS — E034 Atrophy of thyroid (acquired): Secondary | ICD-10-CM

## 2016-05-04 DIAGNOSIS — E119 Type 2 diabetes mellitus without complications: Secondary | ICD-10-CM

## 2016-05-04 DIAGNOSIS — Z7689 Persons encountering health services in other specified circumstances: Secondary | ICD-10-CM

## 2016-05-04 DIAGNOSIS — I10 Essential (primary) hypertension: Secondary | ICD-10-CM

## 2016-05-04 LAB — POCT GLYCOSYLATED HEMOGLOBIN (HGB A1C): HEMOGLOBIN A1C: 5.9

## 2016-05-04 MED ORDER — SYNTHROID 150 MCG PO TABS: 150.0000 ug | ORAL_TABLET | Freq: Every day | ORAL | 3 refills | Status: DC

## 2016-05-04 MED ORDER — METFORMIN HCL 500 MG PO TABS: 500.0000 mg | ORAL_TABLET | Freq: Two times a day (BID) | ORAL | 3 refills | Status: DC

## 2016-05-04 MED ORDER — HYDROCHLOROTHIAZIDE 12.5 MG PO TABS: 12.5000 mg | ORAL_TABLET | Freq: Every day | ORAL | 3 refills | Status: DC

## 2016-05-04 NOTE — Progress Notes (Signed)
Patient presents to clinic today to establish care. 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  Her last physical was in December 2016 with MD Larose Kells    Acute Concerns: Establish Care   Chronic Issues: DM - is well controlled on Metformin and with diet. Her A1c was 6.1 in December 2016. She has been working on diet but is not exercising. Today in the office her A1c dropped to 5.9. She is not checking blood sugars on a regular basis   Hypertension  - She feels as though this is well controlled. She has not been checking her blood pressures on a regular basis. Denies any dizziness or lightheadedness but she does state " I know when I forget to take my medications for blood pressure because I will get a headache.   Hypothyroidism  - Has not had any problems with hypothyroidism.   Health Maintenance: Dental -- Has not been in a year or two Vision -- Does not see  Immunizations -- Needs flu shot  Colonoscopy -- 2013 - 5 years  Mammogram -- November 2nd, 2017  Bone Density -- Never had  Diet: Low sodium, does not eat past 6 pm. Eats a lot of fruits and vegetables Exercise: Does not formally exercise. She would like to join a gym   Is followed by - GYN   Past Medical History:  Diagnosis Date  . Allergic rhinitis   . Anemia    h/o  . Anxiety   . Asthma    Pulmonary function Tests 09-2006: mild asthma  . Diabetes mellitus   . Heart murmur    ECHO 5/10 was neg  . Hypertension   . Hypothyroidism   . 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 Schizophrenia ([place])   . Vaginal discharge 03/2015  . Vaginal infection 03/2015    Past Surgical History:  Procedure  Laterality Date  . ABDOMINAL HYSTERECTOMY  2011   no oophorectomy  . TONSILLECTOMY      Current Outpatient Prescriptions on File Prior to Visit  Medication Sig Dispense Refill  . aspirin 81 MG tablet Take 81 mg by mouth daily.      Marland Kitchen estradiol (ESTRACE) 0.5 MG tablet Take 0.5 mg by mouth daily.  11  . potassium chloride (K-DUR) 10 MEQ tablet Take 1 tablet (10 mEq total) by mouth daily. 30 tablet 6  . QUEtiapine (SEROQUEL) 50 MG tablet Take 150 mg by mouth at bedtime.    Marland Kitchen SYNTHROID 150 MCG tablet Take 1 tablet (150 mcg total) by mouth daily before breakfast. 30 tablet 0  . valsartan-hydrochlorothiazide (DIOVAN-HCT) 160-12.5 MG tablet Take 1 tablet by mouth daily. 30 tablet 6  . ketoconazole (NIZORAL) 2 % cream Apply 1 application topically 2 (two) times daily. To affected area  0   No current facility-administered medications on file prior to visit.     No Known Allergies  Family History  Problem Relation Age of Onset  . Hypertension Father   . Hypothyroidism Mother   . Graves' disease Daughter   . Diabetes Sister   . Heart attack Neg Hx   . Colon cancer Neg Hx   . Breast cancer Neg Hx     Social History   Social  History  . Marital status: Married    Spouse name: N/A  . Number of children: 1  . Years of education: N/A   Occupational History  . not working-retired    Social History Main Topics  . Smoking status: Never Smoker  . Smokeless tobacco: Never Used  . Alcohol use No  . Drug use: No  . Sexual activity: Not on file   Other Topics Concern  . Not on file   Social History Narrative   Retired - from [employer] w/ husband, married for 20 years       She likes to watch tv and travel           Review of Systems  Constitutional: Negative.   Eyes: Negative.   Respiratory: Negative.   Cardiovascular: Positive for leg swelling (chronic ).  Gastrointestinal: Negative.   Genitourinary: Negative.   Musculoskeletal: Positive for joint pain  (chronic).  Skin: Negative.   Neurological: Negative.   Endo/Heme/Allergies: Negative.   Psychiatric/Behavioral: Negative.   All other systems reviewed and are negative.   BP (!) 152/80   Temp 98.2 F (36.8 C) (Oral)   Ht _0  (1.575 m)   Wt 292 lb 11.2 oz (132.8 kg)   BMI 53.54 kg/m   Physical Exam  Constitutional: She is oriented to person, place, and time and well-developed, well-nourished, and in no distress. No distress.  HENT:  Head: Normocephalic and atraumatic.  Right Ear: External ear normal.  Left Ear: External ear normal.  Nose: Nose normal.  Mouth/Throat: Oropharynx is clear and moist. No oropharyngeal exudate.  Eyes: Conjunctivae and EOM are normal. Pupils are equal, round, and reactive to light. Right eye exhibits no discharge. Left eye exhibits no discharge. No scleral icterus.  Neck: Normal range of motion. Neck supple. No JVD present. No tracheal deviation present. No thyromegaly present.  Cardiovascular: Normal rate, regular rhythm and intact distal pulses.  Exam reveals no gallop and no friction rub.   Murmur heard. Pulmonary/Chest: Effort normal and breath sounds normal. No stridor. No respiratory distress. She has no wheezes. She has no rales. She exhibits no tenderness.  Abdominal: Soft. Bowel sounds are normal. She exhibits no distension and no mass. There is no tenderness. There is no rebound and no guarding.  Musculoskeletal: Normal range of motion. She exhibits edema (non pitting bilateral edema in lower extremities) and tenderness (tenderness around left ankle, medial side).  Lymphadenopathy:    She has no cervical adenopathy.  Neurological: She is alert and oriented to person, place, and time. She displays normal reflexes. No cranial nerve deficit. She exhibits normal muscle tone. Gait normal. Coordination normal. GCS score is 15.  Skin: Skin is warm and dry. No rash noted. She is not diaphoretic. No erythema. No pallor.  Psychiatric: Mood, memory,  affect and judgment normal.  Nursing note and vitals reviewed.   Recent Results (from the past 2160 hour(s))  CBG monitoring, ED     Status: Abnormal   Collection Time: 03/27/16  9:28 PM  Result Value Ref Range   Glucose-Capillary 104 (H) 65 - 99 mg/dL  CBC     Status: Abnormal   Collection Time: 03/27/16  9:33 PM  Result Value Ref Range   WBC 12.2 (H) 4.0 - 10.5 K/uL   RBC 4.24 3.87 - 5.11 MIL/uL   Hemoglobin 12.2 12.0 - 15.0 g/dL   HCT 36.3 36.0 - 46.0 %   MCV 85.6 78.0 - 100.0 fL  MCH 28.8 26.0 - 34.0 pg   MCHC 33.6 30.0 - 36.0 g/dL   RDW 16.1 (H) 11.5 - 15.5 %   Platelets 358 150 - 400 K/uL  I-Stat CG4 Lactic Acid, ED     Status: None   Collection Time: 03/27/16  9:39 PM  Result Value Ref Range   Lactic Acid, Venous 1.28 0.5 - 1.9 mmol/L  Urine rapid drug screen (hosp performed)not at [facility]     Status: None   Collection Time: 03/27/16 10:00 PM  Result Value Ref Range   Opiates NONE DETECTED NONE DETECTED   Cocaine NONE DETECTED NONE DETECTED   Benzodiazepines NONE DETECTED NONE DETECTED   Amphetamines NONE DETECTED NONE DETECTED   Tetrahydrocannabinol NONE DETECTED NONE DETECTED   Barbiturates NONE DETECTED NONE DETECTED    Comment:        DRUG SCREEN FOR MEDICAL PURPOSES ONLY.  IF CONFIRMATION IS NEEDED FOR ANY PURPOSE, NOTIFY LAB WITHIN 5 DAYS.        LOWEST DETECTABLE LIMITS FOR URINE DRUG SCREEN Drug Class       Cutoff (ng/mL) Amphetamine      1000 Barbiturate      200 Benzodiazepine   621 Tricyclics       308 Opiates          300 Cocaine          300 THC              50   Urine culture     Status: Abnormal   Collection Time: 03/27/16 10:00 PM  Result Value Ref Range   Specimen Description URINE, CLEAN CATCH    Special Requests NONE    Culture MULTIPLE SPECIES PRESENT, SUGGEST RECOLLECTION (A)    Report Status 03/29/2016 FINAL   Urinalysis, Routine w reflex microscopic (not at [facility])     Status: None   Collection Time: 03/27/16 10:00 PM  Result Value  Ref Range   Color, Urine YELLOW YELLOW   APPearance CLEAR CLEAR   Specific Gravity, Urine 1.010 1.005 - 1.030   pH 7.0 5.0 - 8.0   Glucose, UA NEGATIVE NEGATIVE mg/dL   Hgb urine dipstick NEGATIVE NEGATIVE   Bilirubin Urine NEGATIVE NEGATIVE   Ketones, ur NEGATIVE NEGATIVE mg/dL   Protein, ur NEGATIVE NEGATIVE mg/dL   Nitrite NEGATIVE NEGATIVE   Leukocytes, UA NEGATIVE NEGATIVE    Comment: MICROSCOPIC NOT DONE ON URINES WITH NEGATIVE PROTEIN, BLOOD, LEUKOCYTES, NITRITE, OR GLUCOSE <1000 mg/dL.  CBC WITH DIFFERENTIAL     Status: Abnormal   Collection Time: 03/27/16 10:30 PM  Result Value Ref Range   WBC 13.9 (H) 4.0 - 10.5 K/uL   RBC 4.24 3.87 - 5.11 MIL/uL   Hemoglobin 12.0 12.0 - 15.0 g/dL   HCT 37.4 36.0 - 46.0 %   MCV 88.2 78.0 - 100.0 fL   MCH 28.3 26.0 - 34.0 pg   MCHC 32.1 30.0 - 36.0 g/dL   RDW 15.7 (H) 11.5 - 15.5 %   Platelets 312 150 - 400 K/uL   Neutrophils Relative % 71 %   Neutro Abs 9.9 (H) 1.7 - 7.7 K/uL   Lymphocytes Relative 22 %   Lymphs Abs 3.0 0.7 - 4.0 K/uL   Monocytes Relative 4 %   Monocytes Absolute 0.6 0.1 - 1.0 K/uL   Eosinophils Relative 3 %   Eosinophils Absolute 0.4 0.0 - 0.7 K/uL   Basophils Relative 0 %   Basophils Absolute 0.1 0.0 - 0.1 K/uL  Ethanol  Status: None   Collection Time: 03/27/16 10:31 PM  Result Value Ref Range   Alcohol, Ethyl (B) <5 <5 mg/dL    Comment:        LOWEST DETECTABLE LIMIT FOR SERUM ALCOHOL IS 5 mg/dL FOR MEDICAL PURPOSES ONLY   TSH     Status: None   Collection Time: 03/27/16 10:31 PM  Result Value Ref Range   TSH 2.608 0.350 - 4.500 uIU/mL  Ammonia     Status: None   Collection Time: 03/27/16 10:31 PM  Result Value Ref Range   Ammonia 14 9 - 35 umol/L  Comprehensive metabolic panel     Status: Abnormal   Collection Time: 03/27/16 10:31 PM  Result Value Ref Range   Sodium 139 135 - 145 mmol/L   Potassium 3.4 (L) 3.5 - 5.1 mmol/L   Chloride 102 101 - 111 mmol/L   CO2 30 22 - 32 mmol/L   Glucose,  Bld 109 (H) 65 - 99 mg/dL   BUN 13 6 - 20 mg/dL   Creatinine, Ser 0.67 0.44 - 1.00 mg/dL   Calcium 9.6 8.9 - 10.3 mg/dL   Total Protein 8.3 (H) 6.5 - 8.1 g/dL   Albumin 4.1 3.5 - 5.0 g/dL   AST 28 15 - 41 U/L   ALT 41 14 - 54 U/L   Alkaline Phosphatase 73 38 - 126 U/L   Total Bilirubin 0.5 0.3 - 1.2 mg/dL   GFR calc non Af Amer >60 >60 mL/min   GFR calc Af Amer >60 >60 mL/min    Comment: (NOTE) The eGFR has been calculated using the CKD EPI equation. This calculation has not been validated in all clinical situations. eGFR's persistently <60 mL/min signify possible Chronic Kidney Disease.    Anion gap 7 5 - 15  I-Stat Troponin, ED (not at [facility])     Status: None   Collection Time: 03/27/16 10:37 PM  Result Value Ref Range   Troponin i, poc 0.01 0.00 - 0.08 ng/mL   Comment 3            Comment: Due to the release kinetics of cTnI, a negative result within the first hours of the onset of symptoms does not rule out myocardial infarction with certainty. If myocardial infarction is still suspected, repeat the test at appropriate intervals.     Assessment/Plan: 1. Encounter to establish care - Follow up in December for CPE - Follow up sooner if needed - Work in eating a diabetic diet and routine aerobic exercise  2. Type 2 diabetes mellitus without complication, without long-term current use of insulin (HCC)  - metFORMIN (GLUCOPHAGE) 500 MG tablet; Take 1 tablet (500 mg total) by mouth 2 (two) times daily with a meal.  Dispense: 180 tablet; Refill: 3 - POC HgB A1c- 5.9 - Continue with current therapy.  - Educated on the importance of diet and exercise. I would like her to start walking 30 minutes a day.   3. Essential hypertension - near goal. I will add 12.5 mg HCTZ to her regimen to help with hypertension and lower extremity edema - hydrochlorothiazide (HYDRODIURIL) 12.5 MG tablet; Take 1 tablet (12.5 mg total) by mouth daily.  Dispense: 90 tablet; Refill: 3  4. Need for  prophylactic vaccination and inoculation against influenza  - Flu Vaccine QUAD 36+ mos PF IM (Fluarix & Fluzone Quad PF)  5. Medication refill  - SYNTHROID 150 MCG tablet; Take 1 tablet (150 mcg total) by mouth daily before breakfast.  Dispense:  90 tablet; Refill: 3 - hydrochlorothiazide (HYDRODIURIL) 12.5 MG tablet; Take 1 tablet (12.5 mg total) by mouth daily.  Dispense: 90 tablet; Refill: 3 - metFORMIN (GLUCOPHAGE) 500 MG tablet; Take 1 tablet (500 mg total) by mouth 2 (two) times daily with a meal.  Dispense: 180 tablet; Refill: 3  6. Hypothyroidism due to acquired atrophy of thyroid  - SYNTHROID 150 MCG tablet; Take 1 tablet (150 mcg total) by mouth daily before breakfast.  Dispense: 90 tablet; Refill: 3  Dorothyann Peng, NP

## 2016-05-04 NOTE — Patient Instructions (Addendum)
It was great meeting you today  Please follow up in December for your physical, if you need anything before that, please let me know  I have sent in refills of your medications.   Take Ibuprofen 600mg  ( 3 pills) every 8 hours for the next 3-5 days for the right arm pain   Please start walking for 30 minutes every evening.

## 2016-05-23 ENCOUNTER — Other Ambulatory Visit: Payer: Self-pay | Admitting: Adult Health

## 2016-05-23 DIAGNOSIS — E034 Atrophy of thyroid (acquired): Secondary | ICD-10-CM

## 2016-05-23 DIAGNOSIS — Z760 Encounter for issue of repeat prescription: Secondary | ICD-10-CM

## 2016-05-23 MED ORDER — SYNTHROID 150 MCG PO TABS: 150.0000 ug | ORAL_TABLET | Freq: Every day | ORAL | 3 refills | Status: AC

## 2016-05-23 NOTE — Telephone Encounter (Signed)
Pt need new Rx for SYNTHROID  Pharm:  [facility] on [address].

## 2016-05-23 NOTE — Telephone Encounter (Signed)
Rx done. 

## 2016-06-01 ENCOUNTER — Telehealth: Payer: Self-pay | Admitting: Internal Medicine

## 2016-06-01 NOTE — Telephone Encounter (Signed)
Received signed domestic return receipt verifying delivery of Dismissal certified letter on May 31, 2016. Article number [id number] [place]

## 2016-06-08 ENCOUNTER — Telehealth: Payer: Self-pay | Admitting: Adult Health

## 2016-06-08 NOTE — Telephone Encounter (Signed)
Please advise 

## 2016-06-08 NOTE — Telephone Encounter (Signed)
Daughter called to ask if you could order an xray for pt's left foot. She states you have seen the foot and it is not better.

## 2016-06-09 ENCOUNTER — Telehealth: Payer: Self-pay | Admitting: Adult Health

## 2016-06-09 DIAGNOSIS — E119 Type 2 diabetes mellitus without complications: Secondary | ICD-10-CM

## 2016-06-09 DIAGNOSIS — Z760 Encounter for issue of repeat prescription: Secondary | ICD-10-CM

## 2016-06-09 DIAGNOSIS — I10 Essential (primary) hypertension: Secondary | ICD-10-CM

## 2016-06-09 MED ORDER — VALSARTAN-HYDROCHLOROTHIAZIDE 160-12.5 MG PO TABS: 1.0000 | ORAL_TABLET | Freq: Every day | ORAL | 1 refills | Status: AC

## 2016-06-09 MED ORDER — QUETIAPINE FUMARATE 50 MG PO TABS: 150.0000 mg | ORAL_TABLET | Freq: Every day | ORAL | 0 refills | Status: AC

## 2016-06-09 MED ORDER — METFORMIN HCL 500 MG PO TABS: 500.0000 mg | ORAL_TABLET | Freq: Two times a day (BID) | ORAL | 0 refills | Status: AC

## 2016-06-09 MED ORDER — HYDROCHLOROTHIAZIDE 12.5 MG PO TABS: 12.5000 mg | ORAL_TABLET | Freq: Every day | ORAL | 0 refills | Status: AC

## 2016-06-09 NOTE — Telephone Encounter (Signed)
Is this ok?

## 2016-06-09 NOTE — Telephone Encounter (Signed)
I spoke to Taylor Ramirez on the phone and informed her that there are has been a mistake and due to her dismissal by Dr. Larose Kells that policy dictates that I am unable to see her as a patient because she's been discharged from all of [place]. I advised her that I will refill her medications for 90 days and that she should try following up with [facility] family physicians.  Taylor Ramirez was okay with this plan

## 2016-06-09 NOTE — Telephone Encounter (Signed)
New pharm is [facility]

## 2016-06-09 NOTE — Telephone Encounter (Signed)
New pharmacy would like to know if the pt should be taking both hydrochlorothiazide and valsartan-hydrochlorothiazide (DIOVAN-HCT).  Pls call with clarification

## 2016-06-10 NOTE — Telephone Encounter (Signed)
Daughter states the original question about the  hydrochlorothiazide (HYDRODIURIL) 12.5 MG tablet And valsartan-hydrochlorothiazide (DIOVAN-HCT) 160-12.5 MG tablet   was never answered. Daughter states she is going to drive out here to find out because she knows pt is not taking the med correctly.

## 2016-06-10 NOTE — Telephone Encounter (Signed)
Will route to Cory as FYI. 

## 2016-07-07 ENCOUNTER — Other Ambulatory Visit: Payer: Self-pay

## 2016-07-13 ENCOUNTER — Encounter: Payer: Self-pay | Admitting: Adult Health

## 2016-08-31 ENCOUNTER — Encounter: Payer: Self-pay | Admitting: Gastroenterology

## 2016-10-25 DIAGNOSIS — F310 Bipolar disorder, current episode hypomanic: Secondary | ICD-10-CM | POA: Diagnosis not present

## 2016-12-14 DIAGNOSIS — R1032 Left lower quadrant pain: Secondary | ICD-10-CM | POA: Diagnosis not present

## 2016-12-27 DIAGNOSIS — F310 Bipolar disorder, current episode hypomanic: Secondary | ICD-10-CM | POA: Diagnosis not present

## 2017-01-06 DIAGNOSIS — M25511 Pain in right shoulder: Secondary | ICD-10-CM | POA: Diagnosis not present

## 2017-01-06 DIAGNOSIS — G8929 Other chronic pain: Secondary | ICD-10-CM | POA: Diagnosis not present

## 2017-01-09 ENCOUNTER — Ambulatory Visit
Admission: RE | Admit: 2017-01-09 | Discharge: 2017-01-09 | Disposition: A | Discharge status: Home / Self Care | Source: Ambulatory Visit | Attending: Family Medicine | Admitting: Family Medicine

## 2017-01-09 ENCOUNTER — Other Ambulatory Visit: Payer: Self-pay | Admitting: Family Medicine

## 2017-01-09 DIAGNOSIS — G8929 Other chronic pain: Secondary | ICD-10-CM

## 2017-01-09 DIAGNOSIS — M19011 Primary osteoarthritis, right shoulder: Secondary | ICD-10-CM | POA: Diagnosis not present

## 2017-01-20 DIAGNOSIS — M67911 Unspecified disorder of synovium and tendon, right shoulder: Secondary | ICD-10-CM | POA: Diagnosis not present

## 2017-02-17 DIAGNOSIS — H40023 Open angle with borderline findings, high risk, bilateral: Secondary | ICD-10-CM | POA: Diagnosis not present

## 2017-02-17 DIAGNOSIS — H35033 Hypertensive retinopathy, bilateral: Secondary | ICD-10-CM | POA: Diagnosis not present

## 2017-02-17 DIAGNOSIS — E119 Type 2 diabetes mellitus without complications: Secondary | ICD-10-CM | POA: Diagnosis not present

## 2017-02-17 DIAGNOSIS — H524 Presbyopia: Secondary | ICD-10-CM | POA: Diagnosis not present

## 2017-02-17 DIAGNOSIS — H52223 Regular astigmatism, bilateral: Secondary | ICD-10-CM | POA: Diagnosis not present

## 2017-02-17 DIAGNOSIS — H5213 Myopia, bilateral: Secondary | ICD-10-CM | POA: Diagnosis not present

## 2017-02-17 DIAGNOSIS — H2513 Age-related nuclear cataract, bilateral: Secondary | ICD-10-CM | POA: Diagnosis not present

## 2017-02-23 DIAGNOSIS — F310 Bipolar disorder, current episode hypomanic: Secondary | ICD-10-CM | POA: Diagnosis not present

## 2017-03-03 DIAGNOSIS — M67911 Unspecified disorder of synovium and tendon, right shoulder: Secondary | ICD-10-CM | POA: Diagnosis not present

## 2017-03-08 DIAGNOSIS — M25511 Pain in right shoulder: Secondary | ICD-10-CM | POA: Diagnosis not present

## 2017-03-08 DIAGNOSIS — M67911 Unspecified disorder of synovium and tendon, right shoulder: Secondary | ICD-10-CM | POA: Diagnosis not present

## 2017-03-14 DIAGNOSIS — M25511 Pain in right shoulder: Secondary | ICD-10-CM | POA: Diagnosis not present

## 2017-03-14 DIAGNOSIS — M67911 Unspecified disorder of synovium and tendon, right shoulder: Secondary | ICD-10-CM | POA: Diagnosis not present

## 2017-03-16 DIAGNOSIS — M67911 Unspecified disorder of synovium and tendon, right shoulder: Secondary | ICD-10-CM | POA: Diagnosis not present

## 2017-03-16 DIAGNOSIS — M25511 Pain in right shoulder: Secondary | ICD-10-CM | POA: Diagnosis not present

## 2017-03-17 DIAGNOSIS — J069 Acute upper respiratory infection, unspecified: Secondary | ICD-10-CM | POA: Diagnosis not present

## 2017-03-21 DIAGNOSIS — M25511 Pain in right shoulder: Secondary | ICD-10-CM | POA: Diagnosis not present

## 2017-03-21 DIAGNOSIS — M67911 Unspecified disorder of synovium and tendon, right shoulder: Secondary | ICD-10-CM | POA: Diagnosis not present

## 2017-03-23 DIAGNOSIS — M67911 Unspecified disorder of synovium and tendon, right shoulder: Secondary | ICD-10-CM | POA: Diagnosis not present

## 2017-03-23 DIAGNOSIS — M25511 Pain in right shoulder: Secondary | ICD-10-CM | POA: Diagnosis not present

## 2017-03-28 DIAGNOSIS — M67911 Unspecified disorder of synovium and tendon, right shoulder: Secondary | ICD-10-CM | POA: Diagnosis not present

## 2017-03-28 DIAGNOSIS — M25511 Pain in right shoulder: Secondary | ICD-10-CM | POA: Diagnosis not present

## 2017-04-04 DIAGNOSIS — M67911 Unspecified disorder of synovium and tendon, right shoulder: Secondary | ICD-10-CM | POA: Diagnosis not present

## 2017-04-04 DIAGNOSIS — M25511 Pain in right shoulder: Secondary | ICD-10-CM | POA: Diagnosis not present

## 2017-04-05 DIAGNOSIS — G8929 Other chronic pain: Secondary | ICD-10-CM | POA: Diagnosis not present

## 2017-04-05 DIAGNOSIS — M7662 Achilles tendinitis, left leg: Secondary | ICD-10-CM | POA: Diagnosis not present

## 2017-04-05 DIAGNOSIS — M79671 Pain in right foot: Secondary | ICD-10-CM | POA: Diagnosis not present

## 2017-04-07 DIAGNOSIS — M67911 Unspecified disorder of synovium and tendon, right shoulder: Secondary | ICD-10-CM | POA: Diagnosis not present

## 2017-04-07 DIAGNOSIS — M25511 Pain in right shoulder: Secondary | ICD-10-CM | POA: Diagnosis not present

## 2017-04-14 DIAGNOSIS — M25511 Pain in right shoulder: Secondary | ICD-10-CM | POA: Diagnosis not present

## 2017-04-17 DIAGNOSIS — M25511 Pain in right shoulder: Secondary | ICD-10-CM | POA: Diagnosis not present

## 2017-04-17 DIAGNOSIS — M67911 Unspecified disorder of synovium and tendon, right shoulder: Secondary | ICD-10-CM | POA: Diagnosis not present

## 2017-04-19 DIAGNOSIS — I10 Essential (primary) hypertension: Secondary | ICD-10-CM | POA: Diagnosis not present

## 2017-04-20 DIAGNOSIS — M79671 Pain in right foot: Secondary | ICD-10-CM | POA: Diagnosis not present

## 2017-04-20 DIAGNOSIS — M79672 Pain in left foot: Secondary | ICD-10-CM | POA: Diagnosis not present

## 2017-04-20 DIAGNOSIS — S86002D Unspecified injury of left Achilles tendon, subsequent encounter: Secondary | ICD-10-CM | POA: Diagnosis not present

## 2017-04-25 DIAGNOSIS — M79671 Pain in right foot: Secondary | ICD-10-CM | POA: Diagnosis not present

## 2017-04-25 DIAGNOSIS — S86002D Unspecified injury of left Achilles tendon, subsequent encounter: Secondary | ICD-10-CM | POA: Diagnosis not present

## 2017-04-25 DIAGNOSIS — M79672 Pain in left foot: Secondary | ICD-10-CM | POA: Diagnosis not present

## 2017-04-27 DIAGNOSIS — S86002D Unspecified injury of left Achilles tendon, subsequent encounter: Secondary | ICD-10-CM | POA: Diagnosis not present

## 2017-04-27 DIAGNOSIS — M67911 Unspecified disorder of synovium and tendon, right shoulder: Secondary | ICD-10-CM | POA: Diagnosis not present

## 2017-04-27 DIAGNOSIS — M79672 Pain in left foot: Secondary | ICD-10-CM | POA: Diagnosis not present

## 2017-04-27 DIAGNOSIS — M79671 Pain in right foot: Secondary | ICD-10-CM | POA: Diagnosis not present

## 2017-05-02 DIAGNOSIS — M79671 Pain in right foot: Secondary | ICD-10-CM | POA: Diagnosis not present

## 2017-05-02 DIAGNOSIS — M79672 Pain in left foot: Secondary | ICD-10-CM | POA: Diagnosis not present

## 2017-05-02 DIAGNOSIS — S86002D Unspecified injury of left Achilles tendon, subsequent encounter: Secondary | ICD-10-CM | POA: Diagnosis not present

## 2017-05-04 DIAGNOSIS — M67911 Unspecified disorder of synovium and tendon, right shoulder: Secondary | ICD-10-CM | POA: Diagnosis not present

## 2017-05-04 DIAGNOSIS — M25511 Pain in right shoulder: Secondary | ICD-10-CM | POA: Diagnosis not present

## 2017-05-22 DIAGNOSIS — S86002D Unspecified injury of left Achilles tendon, subsequent encounter: Secondary | ICD-10-CM | POA: Diagnosis not present

## 2017-05-22 DIAGNOSIS — M79672 Pain in left foot: Secondary | ICD-10-CM | POA: Diagnosis not present

## 2017-05-22 DIAGNOSIS — M79671 Pain in right foot: Secondary | ICD-10-CM | POA: Diagnosis not present

## 2017-05-23 DIAGNOSIS — F310 Bipolar disorder, current episode hypomanic: Secondary | ICD-10-CM | POA: Diagnosis not present

## 2017-05-24 DIAGNOSIS — M79672 Pain in left foot: Secondary | ICD-10-CM | POA: Diagnosis not present

## 2017-05-24 DIAGNOSIS — S86002D Unspecified injury of left Achilles tendon, subsequent encounter: Secondary | ICD-10-CM | POA: Diagnosis not present

## 2017-05-24 DIAGNOSIS — M79671 Pain in right foot: Secondary | ICD-10-CM | POA: Diagnosis not present

## 2017-05-25 DIAGNOSIS — Z23 Encounter for immunization: Secondary | ICD-10-CM | POA: Diagnosis not present

## 2017-05-29 DIAGNOSIS — M79672 Pain in left foot: Secondary | ICD-10-CM | POA: Diagnosis not present

## 2017-05-29 DIAGNOSIS — M79671 Pain in right foot: Secondary | ICD-10-CM | POA: Diagnosis not present

## 2017-05-29 DIAGNOSIS — S86002D Unspecified injury of left Achilles tendon, subsequent encounter: Secondary | ICD-10-CM | POA: Diagnosis not present

## 2017-05-30 DIAGNOSIS — E039 Hypothyroidism, unspecified: Secondary | ICD-10-CM | POA: Diagnosis not present

## 2017-05-30 DIAGNOSIS — M2142 Flat foot [pes planus] (acquired), left foot: Secondary | ICD-10-CM | POA: Diagnosis not present

## 2017-05-30 DIAGNOSIS — M2141 Flat foot [pes planus] (acquired), right foot: Secondary | ICD-10-CM | POA: Diagnosis not present

## 2017-05-30 DIAGNOSIS — E538 Deficiency of other specified B group vitamins: Secondary | ICD-10-CM | POA: Diagnosis not present

## 2017-05-31 DIAGNOSIS — S86002D Unspecified injury of left Achilles tendon, subsequent encounter: Secondary | ICD-10-CM | POA: Diagnosis not present

## 2017-05-31 DIAGNOSIS — M79672 Pain in left foot: Secondary | ICD-10-CM | POA: Diagnosis not present

## 2017-05-31 DIAGNOSIS — M79671 Pain in right foot: Secondary | ICD-10-CM | POA: Diagnosis not present

## 2017-06-05 DIAGNOSIS — S89002D Unspecified physeal fracture of upper end of left tibia, subsequent encounter for fracture with routine healing: Secondary | ICD-10-CM | POA: Diagnosis not present

## 2017-06-05 DIAGNOSIS — M79671 Pain in right foot: Secondary | ICD-10-CM | POA: Diagnosis not present

## 2017-06-05 DIAGNOSIS — M79672 Pain in left foot: Secondary | ICD-10-CM | POA: Diagnosis not present

## 2017-06-07 DIAGNOSIS — M79671 Pain in right foot: Secondary | ICD-10-CM | POA: Diagnosis not present

## 2017-06-07 DIAGNOSIS — M79672 Pain in left foot: Secondary | ICD-10-CM | POA: Diagnosis not present

## 2017-06-07 DIAGNOSIS — S86002D Unspecified injury of left Achilles tendon, subsequent encounter: Secondary | ICD-10-CM | POA: Diagnosis not present

## 2017-06-19 DIAGNOSIS — S86002D Unspecified injury of left Achilles tendon, subsequent encounter: Secondary | ICD-10-CM | POA: Diagnosis not present

## 2017-06-19 DIAGNOSIS — M79672 Pain in left foot: Secondary | ICD-10-CM | POA: Diagnosis not present

## 2017-06-19 DIAGNOSIS — M79671 Pain in right foot: Secondary | ICD-10-CM | POA: Diagnosis not present

## 2017-06-21 DIAGNOSIS — M170 Bilateral primary osteoarthritis of knee: Secondary | ICD-10-CM | POA: Diagnosis not present

## 2017-06-21 DIAGNOSIS — M1712 Unilateral primary osteoarthritis, left knee: Secondary | ICD-10-CM | POA: Diagnosis not present

## 2017-06-26 DIAGNOSIS — M79672 Pain in left foot: Secondary | ICD-10-CM | POA: Diagnosis not present

## 2017-06-26 DIAGNOSIS — S86002D Unspecified injury of left Achilles tendon, subsequent encounter: Secondary | ICD-10-CM | POA: Diagnosis not present

## 2017-06-26 DIAGNOSIS — M79671 Pain in right foot: Secondary | ICD-10-CM | POA: Diagnosis not present

## 2017-06-27 DIAGNOSIS — Z23 Encounter for immunization: Secondary | ICD-10-CM | POA: Diagnosis not present

## 2017-06-27 DIAGNOSIS — Z1211 Encounter for screening for malignant neoplasm of colon: Secondary | ICD-10-CM | POA: Diagnosis not present

## 2017-06-27 DIAGNOSIS — Z6841 Body Mass Index (BMI) 40.0 and over, adult: Secondary | ICD-10-CM | POA: Diagnosis not present

## 2017-06-27 DIAGNOSIS — E119 Type 2 diabetes mellitus without complications: Secondary | ICD-10-CM | POA: Diagnosis not present

## 2017-06-27 DIAGNOSIS — E039 Hypothyroidism, unspecified: Secondary | ICD-10-CM | POA: Diagnosis not present

## 2017-06-27 DIAGNOSIS — Z0000 Encounter for general adult medical examination without abnormal findings: Secondary | ICD-10-CM | POA: Diagnosis not present

## 2017-06-27 DIAGNOSIS — I10 Essential (primary) hypertension: Secondary | ICD-10-CM | POA: Diagnosis not present

## 2017-07-10 DIAGNOSIS — M79671 Pain in right foot: Secondary | ICD-10-CM | POA: Diagnosis not present

## 2017-07-10 DIAGNOSIS — S86002D Unspecified injury of left Achilles tendon, subsequent encounter: Secondary | ICD-10-CM | POA: Diagnosis not present

## 2017-07-10 DIAGNOSIS — M79672 Pain in left foot: Secondary | ICD-10-CM | POA: Diagnosis not present

## 2017-07-13 ENCOUNTER — Other Ambulatory Visit: Payer: Self-pay | Admitting: Family Medicine

## 2017-07-13 DIAGNOSIS — Z1231 Encounter for screening mammogram for malignant neoplasm of breast: Secondary | ICD-10-CM

## 2017-07-20 DIAGNOSIS — S86002D Unspecified injury of left Achilles tendon, subsequent encounter: Secondary | ICD-10-CM | POA: Diagnosis not present

## 2017-07-20 DIAGNOSIS — M79671 Pain in right foot: Secondary | ICD-10-CM | POA: Diagnosis not present

## 2017-07-20 DIAGNOSIS — M79672 Pain in left foot: Secondary | ICD-10-CM | POA: Diagnosis not present

## 2017-07-21 DIAGNOSIS — J0100 Acute maxillary sinusitis, unspecified: Secondary | ICD-10-CM | POA: Diagnosis not present

## 2017-07-21 DIAGNOSIS — L98499 Non-pressure chronic ulcer of skin of other sites with unspecified severity: Secondary | ICD-10-CM | POA: Diagnosis not present

## 2017-07-21 DIAGNOSIS — H6123 Impacted cerumen, bilateral: Secondary | ICD-10-CM | POA: Diagnosis not present

## 2017-07-26 DIAGNOSIS — M79671 Pain in right foot: Secondary | ICD-10-CM | POA: Diagnosis not present

## 2017-07-26 DIAGNOSIS — S86002D Unspecified injury of left Achilles tendon, subsequent encounter: Secondary | ICD-10-CM | POA: Diagnosis not present

## 2017-07-26 DIAGNOSIS — M79672 Pain in left foot: Secondary | ICD-10-CM | POA: Diagnosis not present

## 2017-08-04 ENCOUNTER — Ambulatory Visit (INDEPENDENT_AMBULATORY_CARE_PROVIDER_SITE_OTHER): Payer: Medicare Other | Admitting: Podiatry

## 2017-08-04 ENCOUNTER — Ambulatory Visit (INDEPENDENT_AMBULATORY_CARE_PROVIDER_SITE_OTHER): Payer: Medicare Other

## 2017-08-04 ENCOUNTER — Encounter: Payer: Self-pay | Admitting: Podiatry

## 2017-08-04 DIAGNOSIS — M659 Unspecified synovitis and tenosynovitis, unspecified site: Secondary | ICD-10-CM

## 2017-08-04 DIAGNOSIS — S86312A Strain of muscle(s) and tendon(s) of peroneal muscle group at lower leg level, left leg, initial encounter: Secondary | ICD-10-CM | POA: Diagnosis not present

## 2017-08-04 DIAGNOSIS — M7672 Peroneal tendinitis, left leg: Secondary | ICD-10-CM

## 2017-08-04 DIAGNOSIS — M79671 Pain in right foot: Secondary | ICD-10-CM | POA: Diagnosis not present

## 2017-08-04 DIAGNOSIS — M79672 Pain in left foot: Secondary | ICD-10-CM | POA: Diagnosis not present

## 2017-08-04 MED ORDER — MELOXICAM 15 MG PO TABS: 15.0000 mg | ORAL_TABLET | Freq: Every day | ORAL | 0 refills | Status: DC

## 2017-08-04 NOTE — Progress Notes (Signed)
Subjective:    Patient ID: Taylor Ramirez, female    DOB: 05/02/1956, 61 y.o.   MRN: [id number]  HPI  Chief Complaint  Patient presents with  . Foot Pain    bilateral foot and ankle pain - has been a problem for years - has tried inserts, PT, "you name it" and she is still having opain and swelling   61 y.o. female presents with the above complaint.  Bilateral foot and ankle pain, worse on left.  Reports pain to the outside left ankle.  Denies known injury.  Endorses swelling and pain and has tried inserts, physical therapy over-the-counter medications without any relief.  Past Medical History:  Diagnosis Date  . Allergic rhinitis   . Anemia    h/o  . Anxiety   . Asthma    Pulmonary function Tests 09-2006: mild asthma  . Diabetes mellitus   . Heart murmur    ECHO 5/10 was neg  . Hypertension   . Hypothyroidism   . 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 Psychosis ([place]) 05/16/2014  . Schizophrenia ([place])   . Vaginal discharge 03/2015  . Vaginal infection 03/2015   Past Surgical History:  Procedure Laterality Date  . ABDOMINAL HYSTERECTOMY  2011   no oophorectomy  . TONSILLECTOMY      Current Outpatient Medications:  .  aspirin 81 MG tablet, Take 81 mg by mouth daily.  , Disp: , Rfl:  .  estradiol (ESTRACE) 0.5 MG tablet, Take 0.5 mg by mouth daily., Disp: , Rfl: 11 .  hydrochlorothiazide (HYDRODIURIL) 12.5 MG tablet, Take 1 tablet (12.5 mg total) by mouth daily., Disp: 90 tablet, Rfl: 0 .  ketoconazole (NIZORAL) 2 % cream, Apply 1 application topically 2 (two) times daily. To affected area, Disp: , Rfl: 0 .  meloxicam (MOBIC) 15 MG tablet, Take 1 tablet (15 mg total) by mouth daily., Disp: 30 tablet, Rfl: 0 .  metFORMIN (GLUCOPHAGE) 500 MG tablet, Take 1 tablet (500 mg total) by mouth 2 (two) times daily with a meal., Disp: 180 tablet, Rfl: 0 .  potassium chloride (K-DUR) 10 MEQ tablet, Take 1 tablet (10 mEq total) by mouth daily., Disp: 30  tablet, Rfl: 6 .  QUEtiapine (SEROQUEL) 50 MG tablet, Take 3 tablets (150 mg total) by mouth at bedtime., Disp: 90 tablet, Rfl: 0 .  SYNTHROID 150 MCG tablet, Take 1 tablet (150 mcg total) by mouth daily before breakfast., Disp: 90 tablet, Rfl: 3 .  valsartan-hydrochlorothiazide (DIOVAN-HCT) 160-12.5 MG tablet, Take 1 tablet by mouth daily., Disp: 90 tablet, Rfl: 1  No Known Allergies   Review of Systems  Musculoskeletal: Positive for arthralgias, back pain, gait problem, joint swelling and myalgias.  All other systems reviewed and are negative.     Objective:   Physical Exam There were no vitals filed for this visit. General AA&O x3. Normal mood and affect.  Vascular Dorsalis pedis and posterior tibial pulses  present 2+ bilaterally  Capillary refill normal to all digits. Pedal hair growth normal.  Neurologic Epicritic sensation grossly present.  Dermatologic No open lesions. Interspaces clear of maceration. Nails well groomed and normal in appearance.  Orthopedic: MMT 5/5 in dorsiflexion, plantarflexion, inversion, and eversion. Normal joint ROM without pain or crepitus. To palpation along the left peroneal tendons behind the malleolus.  No pain at the PT or PB insertion.  And with active eversion and passive inversion.     Assessment & Plan:  Patient was  evaluated and treated and all questions answered  Left PT tendinitis with concern for interstitial tear -Discussed with patient that since she has failed all conservative therapy patient would benefit from MRI in order to eval for possible repair. -X-rays reviewed without osseous abnormality. -Rx meloxicam for pain and inflammation  Follow up after MRI

## 2017-08-07 ENCOUNTER — Telehealth: Payer: Self-pay | Admitting: *Deleted

## 2017-08-07 DIAGNOSIS — M659 Synovitis and tenosynovitis, unspecified: Secondary | ICD-10-CM

## 2017-08-07 DIAGNOSIS — M7672 Peroneal tendinitis, left leg: Secondary | ICD-10-CM

## 2017-08-07 DIAGNOSIS — S86312A Strain of muscle(s) and tendon(s) of peroneal muscle group at lower leg level, left leg, initial encounter: Secondary | ICD-10-CM

## 2017-08-07 NOTE — Telephone Encounter (Signed)
-----   Message from Evelina Bucy, DPM sent at 08/05/2017  2:58 PM EST ----- Regarding: MRI Can we order MRI L ankle? Thanks!

## 2017-08-14 ENCOUNTER — Ambulatory Visit
Admission: RE | Admit: 2017-08-14 | Discharge: 2017-08-14 | Disposition: A | Discharge status: Home / Self Care | Payer: Medicare Other | Source: Ambulatory Visit | Attending: Family Medicine | Admitting: Family Medicine

## 2017-08-14 DIAGNOSIS — Z1231 Encounter for screening mammogram for malignant neoplasm of breast: Secondary | ICD-10-CM

## 2017-08-15 ENCOUNTER — Ambulatory Visit
Admission: RE | Admit: 2017-08-15 | Discharge: 2017-08-15 | Disposition: A | Discharge status: Home / Self Care | Payer: Medicare Other | Source: Ambulatory Visit | Attending: Podiatry | Admitting: Podiatry

## 2017-08-15 DIAGNOSIS — M19072 Primary osteoarthritis, left ankle and foot: Secondary | ICD-10-CM | POA: Diagnosis not present

## 2017-09-01 ENCOUNTER — Ambulatory Visit (INDEPENDENT_AMBULATORY_CARE_PROVIDER_SITE_OTHER): Payer: Medicare Other | Admitting: Podiatry

## 2017-09-01 DIAGNOSIS — M7662 Achilles tendinitis, left leg: Secondary | ICD-10-CM

## 2017-09-01 DIAGNOSIS — M659 Unspecified synovitis and tenosynovitis, unspecified site: Secondary | ICD-10-CM

## 2017-09-01 DIAGNOSIS — M7672 Peroneal tendinitis, left leg: Secondary | ICD-10-CM

## 2017-09-05 NOTE — Progress Notes (Signed)
  Subjective:  Patient ID: Taylor Ramirez, female    DOB: May 24, 1956,  MRN: [id number]  Chief Complaint  Patient presents with  . Foot Pain    MRI results, peroneal tendinitis Left ankle   62 y.o. female returns for the above complaint.  Here for MRI review.  Objective:  There were no vitals filed for this visit. General AA&O x3. Normal mood and affect.  Vascular Pedal pulses palpable.  Neurologic Epicritic sensation grossly intact.  Dermatologic No open lesions. Skin normal texture and turgor.  Orthopedic: Pain to palpation about the left ankle at the Achilles tendon insertion, along the peroneal tendons about the tip of the lateral malleolus no pain at the insertion point of the peroneus longus or brevis   Assessment & Plan:  Patient was evaluated and treated and all questions answered.  Peroneal tendonitis, Achilles tendonitis -MRI results reviewed at length with patient.  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 -Discussed with patient conservative versus surgical options at this point.  Discussed that due to both tendon injuries she would need to be nonweightbearing to the lower extremity for several weeks and have aggressive physical therapy for surgery.  Patient wishes to try several more weeks of physical therapy.  We will discuss the patient at next visit after 4 weeks of physical therapy should pain continue to further surgical options.  15 minutes of face to face time were spent with the patient. >50% of this was spent on counseling and coordination of care. Specifically discussed with patient the above diagnoses and treatment plans.   Return in about 4 weeks (around 09/29/2017) for Tendonitis.

## 2017-09-07 ENCOUNTER — Telehealth: Payer: Self-pay | Admitting: *Deleted

## 2017-09-07 DIAGNOSIS — M7662 Achilles tendinitis, left leg: Secondary | ICD-10-CM

## 2017-09-07 DIAGNOSIS — M659 Synovitis and tenosynovitis, unspecified: Secondary | ICD-10-CM

## 2017-09-07 DIAGNOSIS — M7672 Peroneal tendinitis, left leg: Secondary | ICD-10-CM

## 2017-09-07 NOTE — Telephone Encounter (Signed)
Hand carried PT rx to [facility].

## 2017-09-07 NOTE — Telephone Encounter (Signed)
-----   Message from Evelina Bucy, DPM sent at 09/05/2017  7:04 PM EST ----- Regarding: PT Referral Can we refer for PT at [facility] at the office. Thanks!  Peroneal Tendonitis, Achilles tendonitis Tendinopathy, interstitial tearing of tendons

## 2017-09-08 ENCOUNTER — Telehealth: Payer: Self-pay | Admitting: Podiatry

## 2017-09-08 ENCOUNTER — Telehealth: Payer: Self-pay | Admitting: *Deleted

## 2017-09-08 NOTE — Telephone Encounter (Signed)
[facility] is unable to get in touch with the pt. Because the number on file is disconnected.

## 2017-09-12 ENCOUNTER — Telehealth: Payer: Self-pay | Admitting: Podiatry

## 2017-09-12 NOTE — Telephone Encounter (Signed)
Unable to find additional contact information.

## 2017-09-12 NOTE — Telephone Encounter (Signed)
Unable to find a alternate contact phone.

## 2017-09-12 NOTE — Telephone Encounter (Signed)
Thank you :)

## 2017-09-12 NOTE — Telephone Encounter (Signed)
Pt called back stating she has not heard from the physical therapist that Dr March Rummage was to be referring pt to.  I explained that they had tried contacting her but her number was disconnected. She said it was messed up but is fixed now. I also went ahead an gave her the number to [facility] for her to call to schedule an appt.

## 2017-09-25 DIAGNOSIS — M25672 Stiffness of left ankle, not elsewhere classified: Secondary | ICD-10-CM | POA: Diagnosis not present

## 2017-09-25 DIAGNOSIS — M25472 Effusion, left ankle: Secondary | ICD-10-CM | POA: Diagnosis not present

## 2017-09-25 DIAGNOSIS — M25572 Pain in left ankle and joints of left foot: Secondary | ICD-10-CM | POA: Diagnosis not present

## 2017-09-27 DIAGNOSIS — F310 Bipolar disorder, current episode hypomanic: Secondary | ICD-10-CM | POA: Diagnosis not present

## 2017-09-29 DIAGNOSIS — R5382 Chronic fatigue, unspecified: Secondary | ICD-10-CM | POA: Diagnosis not present

## 2017-09-29 DIAGNOSIS — I10 Essential (primary) hypertension: Secondary | ICD-10-CM | POA: Diagnosis not present

## 2017-10-06 ENCOUNTER — Encounter (HOSPITAL_BASED_OUTPATIENT_CLINIC_OR_DEPARTMENT_OTHER): Payer: Self-pay

## 2017-10-06 DIAGNOSIS — R0683 Snoring: Secondary | ICD-10-CM

## 2017-10-06 DIAGNOSIS — G4733 Obstructive sleep apnea (adult) (pediatric): Secondary | ICD-10-CM

## 2017-10-11 DIAGNOSIS — M25472 Effusion, left ankle: Secondary | ICD-10-CM | POA: Diagnosis not present

## 2017-10-11 DIAGNOSIS — M25672 Stiffness of left ankle, not elsewhere classified: Secondary | ICD-10-CM | POA: Diagnosis not present

## 2017-10-11 DIAGNOSIS — M25572 Pain in left ankle and joints of left foot: Secondary | ICD-10-CM | POA: Diagnosis not present

## 2017-10-11 DIAGNOSIS — M6281 Muscle weakness (generalized): Secondary | ICD-10-CM | POA: Diagnosis not present

## 2017-10-12 ENCOUNTER — Ambulatory Visit (INDEPENDENT_AMBULATORY_CARE_PROVIDER_SITE_OTHER): Payer: Medicare Other | Admitting: Podiatry

## 2017-10-12 DIAGNOSIS — M7672 Peroneal tendinitis, left leg: Secondary | ICD-10-CM | POA: Diagnosis not present

## 2017-10-12 DIAGNOSIS — M7662 Achilles tendinitis, left leg: Secondary | ICD-10-CM

## 2017-10-12 DIAGNOSIS — M659 Synovitis and tenosynovitis, unspecified: Secondary | ICD-10-CM | POA: Diagnosis not present

## 2017-10-12 MED ORDER — MELOXICAM 15 MG PO TABS: 15.0000 mg | ORAL_TABLET | Freq: Every day | ORAL | 0 refills | Status: DC

## 2017-10-13 ENCOUNTER — Ambulatory Visit: Payer: Medicare Other | Admitting: Podiatry

## 2017-10-16 DIAGNOSIS — M6281 Muscle weakness (generalized): Secondary | ICD-10-CM | POA: Diagnosis not present

## 2017-10-16 DIAGNOSIS — M25672 Stiffness of left ankle, not elsewhere classified: Secondary | ICD-10-CM | POA: Diagnosis not present

## 2017-10-16 DIAGNOSIS — M25472 Effusion, left ankle: Secondary | ICD-10-CM | POA: Diagnosis not present

## 2017-10-16 DIAGNOSIS — M25572 Pain in left ankle and joints of left foot: Secondary | ICD-10-CM | POA: Diagnosis not present

## 2017-10-18 DIAGNOSIS — M25472 Effusion, left ankle: Secondary | ICD-10-CM | POA: Diagnosis not present

## 2017-10-18 DIAGNOSIS — M6281 Muscle weakness (generalized): Secondary | ICD-10-CM | POA: Diagnosis not present

## 2017-10-18 DIAGNOSIS — M25572 Pain in left ankle and joints of left foot: Secondary | ICD-10-CM | POA: Diagnosis not present

## 2017-10-18 DIAGNOSIS — M25672 Stiffness of left ankle, not elsewhere classified: Secondary | ICD-10-CM | POA: Diagnosis not present

## 2017-10-23 ENCOUNTER — Ambulatory Visit (HOSPITAL_BASED_OUTPATIENT_CLINIC_OR_DEPARTMENT_OTHER): Payer: Medicare Other

## 2017-10-24 ENCOUNTER — Telehealth: Payer: Self-pay | Admitting: *Deleted

## 2017-10-24 MED ORDER — MELOXICAM 15 MG PO TABS: 15.0000 mg | ORAL_TABLET | Freq: Every day | ORAL | 0 refills | Status: DC

## 2017-10-24 NOTE — Telephone Encounter (Signed)
Pt stats she would like a refill of her meloxicam.

## 2017-10-24 NOTE — Telephone Encounter (Signed)
I told pt, Dr. March Rummage wanted to see her to make sure her left foot is improving. Pt states she made an appt. I checked and pt has an appt 11/23/2017. I told her I would refill the meloxicam to get her to the next appt time. Pt states understanding.

## 2017-10-25 DIAGNOSIS — M25572 Pain in left ankle and joints of left foot: Secondary | ICD-10-CM | POA: Diagnosis not present

## 2017-10-25 DIAGNOSIS — M25472 Effusion, left ankle: Secondary | ICD-10-CM | POA: Diagnosis not present

## 2017-10-25 DIAGNOSIS — M6281 Muscle weakness (generalized): Secondary | ICD-10-CM | POA: Diagnosis not present

## 2017-10-25 DIAGNOSIS — M25672 Stiffness of left ankle, not elsewhere classified: Secondary | ICD-10-CM | POA: Diagnosis not present

## 2017-10-31 DIAGNOSIS — M25472 Effusion, left ankle: Secondary | ICD-10-CM | POA: Diagnosis not present

## 2017-10-31 DIAGNOSIS — M6281 Muscle weakness (generalized): Secondary | ICD-10-CM | POA: Diagnosis not present

## 2017-10-31 DIAGNOSIS — M25672 Stiffness of left ankle, not elsewhere classified: Secondary | ICD-10-CM | POA: Diagnosis not present

## 2017-10-31 DIAGNOSIS — M25572 Pain in left ankle and joints of left foot: Secondary | ICD-10-CM | POA: Diagnosis not present

## 2017-11-04 NOTE — Progress Notes (Signed)
  Subjective:  Patient ID: Taylor Ramirez, female    DOB: 1956-04-21,  MRN: [id number]  Chief Complaint  Patient presents with  . tendinitis    F/U Lt foot Pt. stated," foot is about the same; 10/10 sharp pain." Tx: tylenol   62 y.o. female returns for the above complaint.  States foot is about the same PPD has been helping.  Reports 10 out of 10 sharp pain.  Has been taking Tylenol for the pain..  Objective:  There were no vitals filed for this visit. General AA&O x3. Normal mood and affect.  Vascular Pedal pulses palpable.  Neurologic Epicritic sensation grossly intact.  Dermatologic No open lesions. Skin normal texture and turgor.  Orthopedic: Pain to palpation about the left ankle at the Achilles tendon insertion, along the peroneal tendons about the tip of the lateral malleolus no pain at the insertion point of the peroneus longus or brevis   Assessment & Plan:  Patient was evaluated and treated and all questions answered.  Peroneal tendonitis, Achilles tendonitis -Continue PT -Refill Meloxicam -Would revisit surgical intervention should pain persist.  Return in about 6 weeks (around 11/23/2017) for Tendinitis.

## 2017-11-06 DIAGNOSIS — M6281 Muscle weakness (generalized): Secondary | ICD-10-CM | POA: Diagnosis not present

## 2017-11-06 DIAGNOSIS — M25572 Pain in left ankle and joints of left foot: Secondary | ICD-10-CM | POA: Diagnosis not present

## 2017-11-06 DIAGNOSIS — M25672 Stiffness of left ankle, not elsewhere classified: Secondary | ICD-10-CM | POA: Diagnosis not present

## 2017-11-06 DIAGNOSIS — M25472 Effusion, left ankle: Secondary | ICD-10-CM | POA: Diagnosis not present

## 2017-11-08 DIAGNOSIS — M25572 Pain in left ankle and joints of left foot: Secondary | ICD-10-CM | POA: Diagnosis not present

## 2017-11-08 DIAGNOSIS — M25472 Effusion, left ankle: Secondary | ICD-10-CM | POA: Diagnosis not present

## 2017-11-08 DIAGNOSIS — M25672 Stiffness of left ankle, not elsewhere classified: Secondary | ICD-10-CM | POA: Diagnosis not present

## 2017-11-08 DIAGNOSIS — M6281 Muscle weakness (generalized): Secondary | ICD-10-CM | POA: Diagnosis not present

## 2017-11-13 DIAGNOSIS — M25672 Stiffness of left ankle, not elsewhere classified: Secondary | ICD-10-CM | POA: Diagnosis not present

## 2017-11-13 DIAGNOSIS — M6281 Muscle weakness (generalized): Secondary | ICD-10-CM | POA: Diagnosis not present

## 2017-11-13 DIAGNOSIS — M25472 Effusion, left ankle: Secondary | ICD-10-CM | POA: Diagnosis not present

## 2017-11-13 DIAGNOSIS — M25572 Pain in left ankle and joints of left foot: Secondary | ICD-10-CM | POA: Diagnosis not present

## 2017-11-15 DIAGNOSIS — M25572 Pain in left ankle and joints of left foot: Secondary | ICD-10-CM | POA: Diagnosis not present

## 2017-11-15 DIAGNOSIS — M25472 Effusion, left ankle: Secondary | ICD-10-CM | POA: Diagnosis not present

## 2017-11-15 DIAGNOSIS — M6281 Muscle weakness (generalized): Secondary | ICD-10-CM | POA: Diagnosis not present

## 2017-11-15 DIAGNOSIS — M25672 Stiffness of left ankle, not elsewhere classified: Secondary | ICD-10-CM | POA: Diagnosis not present

## 2017-11-21 DIAGNOSIS — M25472 Effusion, left ankle: Secondary | ICD-10-CM | POA: Diagnosis not present

## 2017-11-21 DIAGNOSIS — M25572 Pain in left ankle and joints of left foot: Secondary | ICD-10-CM | POA: Diagnosis not present

## 2017-11-21 DIAGNOSIS — M6281 Muscle weakness (generalized): Secondary | ICD-10-CM | POA: Diagnosis not present

## 2017-11-21 DIAGNOSIS — M25672 Stiffness of left ankle, not elsewhere classified: Secondary | ICD-10-CM | POA: Diagnosis not present

## 2017-11-23 ENCOUNTER — Ambulatory Visit: Payer: Medicare Other | Admitting: Podiatry

## 2017-11-29 DIAGNOSIS — M25472 Effusion, left ankle: Secondary | ICD-10-CM | POA: Diagnosis not present

## 2017-11-29 DIAGNOSIS — M6281 Muscle weakness (generalized): Secondary | ICD-10-CM | POA: Diagnosis not present

## 2017-11-29 DIAGNOSIS — M25572 Pain in left ankle and joints of left foot: Secondary | ICD-10-CM | POA: Diagnosis not present

## 2017-11-29 DIAGNOSIS — M25672 Stiffness of left ankle, not elsewhere classified: Secondary | ICD-10-CM | POA: Diagnosis not present

## 2017-11-30 ENCOUNTER — Encounter (HOSPITAL_BASED_OUTPATIENT_CLINIC_OR_DEPARTMENT_OTHER): Payer: Medicare Other

## 2017-11-30 DIAGNOSIS — M25472 Effusion, left ankle: Secondary | ICD-10-CM | POA: Diagnosis not present

## 2017-11-30 DIAGNOSIS — M25572 Pain in left ankle and joints of left foot: Secondary | ICD-10-CM | POA: Diagnosis not present

## 2017-11-30 DIAGNOSIS — M6281 Muscle weakness (generalized): Secondary | ICD-10-CM | POA: Diagnosis not present

## 2017-11-30 DIAGNOSIS — M25672 Stiffness of left ankle, not elsewhere classified: Secondary | ICD-10-CM | POA: Diagnosis not present

## 2017-12-05 DIAGNOSIS — M25472 Effusion, left ankle: Secondary | ICD-10-CM | POA: Diagnosis not present

## 2017-12-05 DIAGNOSIS — M25672 Stiffness of left ankle, not elsewhere classified: Secondary | ICD-10-CM | POA: Diagnosis not present

## 2017-12-05 DIAGNOSIS — M25572 Pain in left ankle and joints of left foot: Secondary | ICD-10-CM | POA: Diagnosis not present

## 2017-12-05 DIAGNOSIS — M6281 Muscle weakness (generalized): Secondary | ICD-10-CM | POA: Diagnosis not present

## 2017-12-06 DIAGNOSIS — M25572 Pain in left ankle and joints of left foot: Secondary | ICD-10-CM | POA: Diagnosis not present

## 2017-12-06 DIAGNOSIS — M25472 Effusion, left ankle: Secondary | ICD-10-CM | POA: Diagnosis not present

## 2017-12-06 DIAGNOSIS — M6281 Muscle weakness (generalized): Secondary | ICD-10-CM | POA: Diagnosis not present

## 2017-12-06 DIAGNOSIS — M25672 Stiffness of left ankle, not elsewhere classified: Secondary | ICD-10-CM | POA: Diagnosis not present

## 2017-12-07 ENCOUNTER — Ambulatory Visit: Payer: Medicare Other | Admitting: Podiatry

## 2017-12-08 ENCOUNTER — Ambulatory Visit: Payer: Medicare Other | Admitting: Podiatry

## 2017-12-12 DIAGNOSIS — M6281 Muscle weakness (generalized): Secondary | ICD-10-CM | POA: Diagnosis not present

## 2017-12-12 DIAGNOSIS — M25572 Pain in left ankle and joints of left foot: Secondary | ICD-10-CM | POA: Diagnosis not present

## 2017-12-12 DIAGNOSIS — M25672 Stiffness of left ankle, not elsewhere classified: Secondary | ICD-10-CM | POA: Diagnosis not present

## 2017-12-12 DIAGNOSIS — M25472 Effusion, left ankle: Secondary | ICD-10-CM | POA: Diagnosis not present

## 2017-12-14 DIAGNOSIS — M25672 Stiffness of left ankle, not elsewhere classified: Secondary | ICD-10-CM | POA: Diagnosis not present

## 2017-12-14 DIAGNOSIS — M25472 Effusion, left ankle: Secondary | ICD-10-CM | POA: Diagnosis not present

## 2017-12-14 DIAGNOSIS — M6281 Muscle weakness (generalized): Secondary | ICD-10-CM | POA: Diagnosis not present

## 2017-12-14 DIAGNOSIS — M25572 Pain in left ankle and joints of left foot: Secondary | ICD-10-CM | POA: Diagnosis not present

## 2017-12-18 DIAGNOSIS — I10 Essential (primary) hypertension: Secondary | ICD-10-CM | POA: Diagnosis not present

## 2017-12-20 DIAGNOSIS — M25672 Stiffness of left ankle, not elsewhere classified: Secondary | ICD-10-CM | POA: Diagnosis not present

## 2017-12-20 DIAGNOSIS — M25572 Pain in left ankle and joints of left foot: Secondary | ICD-10-CM | POA: Diagnosis not present

## 2017-12-20 DIAGNOSIS — M25472 Effusion, left ankle: Secondary | ICD-10-CM | POA: Diagnosis not present

## 2017-12-20 DIAGNOSIS — M6281 Muscle weakness (generalized): Secondary | ICD-10-CM | POA: Diagnosis not present

## 2017-12-22 ENCOUNTER — Encounter: Payer: Self-pay | Admitting: Podiatry

## 2017-12-22 ENCOUNTER — Ambulatory Visit (INDEPENDENT_AMBULATORY_CARE_PROVIDER_SITE_OTHER): Payer: Medicare Other | Admitting: Podiatry

## 2017-12-22 ENCOUNTER — Other Ambulatory Visit: Payer: Self-pay

## 2017-12-22 DIAGNOSIS — M659 Synovitis and tenosynovitis, unspecified: Secondary | ICD-10-CM

## 2017-12-22 DIAGNOSIS — M7662 Achilles tendinitis, left leg: Secondary | ICD-10-CM

## 2017-12-22 DIAGNOSIS — J45909 Unspecified asthma, uncomplicated: Secondary | ICD-10-CM | POA: Insufficient documentation

## 2017-12-22 DIAGNOSIS — B373 Candidiasis of vulva and vagina: Secondary | ICD-10-CM | POA: Insufficient documentation

## 2017-12-22 DIAGNOSIS — M7672 Peroneal tendinitis, left leg: Secondary | ICD-10-CM

## 2017-12-22 DIAGNOSIS — N760 Acute vaginitis: Secondary | ICD-10-CM | POA: Insufficient documentation

## 2017-12-22 DIAGNOSIS — B3731 Acute candidiasis of vulva and vagina: Secondary | ICD-10-CM | POA: Insufficient documentation

## 2017-12-22 MED ORDER — MELOXICAM 15 MG PO TABS: 15.0000 mg | ORAL_TABLET | Freq: Every day | ORAL | 0 refills | Status: DC

## 2017-12-22 NOTE — Progress Notes (Signed)
  Subjective:  Patient ID: Taylor Ramirez, female    DOB: 09-01-1955,  MRN: [id number]  Chief Complaint  Patient presents with  . Foot Pain    Follow up peroneal tendonitis/achilles tendonitis left    "Its definitely much better than it was"   62 y.o. female returns for the above complaint. States PT is working. Pain is much better. Able to exercise now where she wasn't before.  Objective:  There were no vitals filed for this visit. General AA&O x3. Normal mood and affect.  Vascular Pedal pulses palpable.  Neurologic Epicritic sensation grossly intact.  Dermatologic No open lesions. Skin normal texture and turgor.  Orthopedic: Minimal pain to palpation peroneal tendons about the lateral malleolus, Achilles tendon insertion.   Assessment & Plan:  Patient was evaluated and treated and all questions answered.  Peroneal tendonitis, Achilles tendonitis -Continue PT -Refill Meloxicam -Improving, no surgical intervention at this time.  Return in about 6 weeks (around 02/02/2018) for Peroneal tendonitis, Achilles tendonitis.

## 2017-12-27 DIAGNOSIS — M6281 Muscle weakness (generalized): Secondary | ICD-10-CM | POA: Diagnosis not present

## 2017-12-27 DIAGNOSIS — M25472 Effusion, left ankle: Secondary | ICD-10-CM | POA: Diagnosis not present

## 2017-12-27 DIAGNOSIS — M25572 Pain in left ankle and joints of left foot: Secondary | ICD-10-CM | POA: Diagnosis not present

## 2017-12-27 DIAGNOSIS — M25672 Stiffness of left ankle, not elsewhere classified: Secondary | ICD-10-CM | POA: Diagnosis not present

## 2018-01-05 ENCOUNTER — Ambulatory Visit: Payer: Medicare Other | Admitting: Podiatry

## 2018-01-05 DIAGNOSIS — F419 Anxiety disorder, unspecified: Secondary | ICD-10-CM | POA: Diagnosis not present

## 2018-01-05 DIAGNOSIS — F310 Bipolar disorder, current episode hypomanic: Secondary | ICD-10-CM | POA: Diagnosis not present

## 2018-02-02 ENCOUNTER — Ambulatory Visit: Payer: Medicare Other | Admitting: Podiatry

## 2018-02-16 ENCOUNTER — Ambulatory Visit (INDEPENDENT_AMBULATORY_CARE_PROVIDER_SITE_OTHER): Payer: Medicare Other | Admitting: Podiatry

## 2018-02-16 DIAGNOSIS — E1151 Type 2 diabetes mellitus with diabetic peripheral angiopathy without gangrene: Secondary | ICD-10-CM

## 2018-02-16 DIAGNOSIS — M659 Synovitis and tenosynovitis, unspecified: Secondary | ICD-10-CM

## 2018-02-16 DIAGNOSIS — M7672 Peroneal tendinitis, left leg: Secondary | ICD-10-CM | POA: Diagnosis not present

## 2018-02-16 DIAGNOSIS — B351 Tinea unguium: Secondary | ICD-10-CM

## 2018-02-16 MED ORDER — MELOXICAM 15 MG PO TABS: 15.0000 mg | ORAL_TABLET | Freq: Every day | ORAL | 0 refills | Status: AC

## 2018-02-16 NOTE — Progress Notes (Signed)
  Subjective:  Patient ID: Taylor Ramirez, female    DOB: May 03, 1956,  MRN: [id number]  No chief complaint on file.  62 y.o. female returns for the above complaint. Pleased with L ankle, not having any pain.  Objective:  There were no vitals filed for this visit. General AA&O x3. Normal mood and affect.  Vascular Pedal pulses diminished. Non palp PT pulse.  Neurologic Epicritic sensation grossly intact.  Dermatologic No open lesions. Skin normal texture and turgor. Nails elongated dystrophic. Skin xerotic.  Orthopedic: No pain to palpation peroneal tendons about the lateral malleolus, Achilles tendon insertion L   Assessment & Plan:  Patient was evaluated and treated and all questions answered.  Peroneal tendonitis, Achilles tendonitis -No pain today -Refill meloxicam  Diabetes with PAD, Onychomycosis -Educated on diabetic footcare. Diabetic risk level 1 -Nails x10 debrided sharply and manually with large nail nipper and rotary burr.   No follow-ups on file.

## 2018-02-21 DIAGNOSIS — L304 Erythema intertrigo: Secondary | ICD-10-CM | POA: Diagnosis not present

## 2018-04-25 DIAGNOSIS — F310 Bipolar disorder, current episode hypomanic: Secondary | ICD-10-CM | POA: Diagnosis not present

## 2018-04-25 DIAGNOSIS — F419 Anxiety disorder, unspecified: Secondary | ICD-10-CM | POA: Diagnosis not present

## 2018-04-30 DIAGNOSIS — M1990 Unspecified osteoarthritis, unspecified site: Secondary | ICD-10-CM | POA: Diagnosis not present

## 2018-05-24 ENCOUNTER — Encounter: Payer: Self-pay | Admitting: Podiatry

## 2018-05-24 ENCOUNTER — Ambulatory Visit (INDEPENDENT_AMBULATORY_CARE_PROVIDER_SITE_OTHER): Payer: Medicare Other | Admitting: Podiatry

## 2018-05-24 DIAGNOSIS — M79674 Pain in right toe(s): Secondary | ICD-10-CM | POA: Diagnosis not present

## 2018-05-24 DIAGNOSIS — B351 Tinea unguium: Secondary | ICD-10-CM

## 2018-05-24 DIAGNOSIS — M79675 Pain in left toe(s): Secondary | ICD-10-CM

## 2018-05-24 DIAGNOSIS — M2041 Other hammer toe(s) (acquired), right foot: Secondary | ICD-10-CM

## 2018-05-24 DIAGNOSIS — L84 Corns and callosities: Secondary | ICD-10-CM

## 2018-05-24 DIAGNOSIS — M205X1 Other deformities of toe(s) (acquired), right foot: Secondary | ICD-10-CM

## 2018-05-24 DIAGNOSIS — E1151 Type 2 diabetes mellitus with diabetic peripheral angiopathy without gangrene: Secondary | ICD-10-CM

## 2018-05-24 NOTE — Progress Notes (Signed)
Subjective:  Patient ID: Taylor Ramirez, female    DOB: 02-21-1956,  MRN: [id number]  Chief Complaint  Patient presents with  . Callouses    right foot 5th digit corn or callous trim    62 y.o. female presents  for diabetic foot care. Last AMBS was uknown. Complains of recurrent lesion to the right 5th toe.  Denies numbness and tingling in their feet. Denies cramping in legs and thighs.  Review of Systems: Negative except as noted in the HPI. Denies N/V/F/Ch.  Past Medical History:  Diagnosis Date  . Allergic rhinitis   . Anemia    h/o  . Anxiety   . Asthma    Pulmonary function Tests 09-2006: mild asthma  . Diabetes mellitus   . Heart murmur    ECHO 5/10 was neg  . Hypertension   . Hypothyroidism   . 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 Psychosis ([place]) 05/16/2014  . Schizophrenia ([place])   . Vaginal discharge 03/2015  . Vaginal infection 03/2015    Current Outpatient Medications:  .  aspirin 81 MG tablet, Take 81 mg by mouth daily.  , Disp: , Rfl:  .  clobetasol cream (TEMOVATE) 0.05 %, clobetasol 0.05 % topical cream, Disp: , Rfl:  .  estradiol (ESTRACE) 0.5 MG tablet, Take 0.5 mg by mouth daily., Disp: , Rfl: 11 .  hydrochlorothiazide (HYDRODIURIL) 12.5 MG tablet, Take 1 tablet (12.5 mg total) by mouth daily., Disp: 90 tablet, Rfl: 0 .  HYDROcodone-acetaminophen (NORCO/VICODIN) 5-325 MG tablet, hydrocodone 5 mg-acetaminophen 325 mg tablet, Disp: , Rfl:  .  hydrocortisone valerate cream (WESTCORT) 0.2 %, hydrocortisone valerate 0.2 % topical cream, Disp: , Rfl:  .  ibuprofen (ADVIL,MOTRIN) 800 MG tablet, ibuprofen 800 mg tablet  TK 1 T PO Q 8 H, Disp: , Rfl:  .  ketoconazole (NIZORAL) 2 % cream, Apply 1 application topically 2 (two) times daily. To affected area, Disp: , Rfl: 0 .  meloxicam (MOBIC) 15 MG tablet, Take 1 tablet (15 mg total) by mouth daily., Disp: 30 tablet, Rfl: 0 .  metFORMIN (GLUCOPHAGE) 500 MG tablet, Take 1 tablet (500 mg  total) by mouth 2 (two) times daily with a meal., Disp: 180 tablet, Rfl: 0 .  metroNIDAZOLE (FLAGYL) 500 MG tablet, metronidazole 500 mg tablet  take 1 tablet by mouth twice a day for 7 days, Disp: , Rfl:  .  NEOMYCIN-POLYMYXIN-HYDROCORTISONE (CORTISPORIN) 1 % SOLN OTIC solution, Cortisporin 3.5 mg/mL-10,000 unit/mL-1 % ear solution  Instill 4 drops 3 times a day by otic route., Disp: , Rfl:  .  potassium chloride (K-DUR) 10 MEQ tablet, Take 1 tablet (10 mEq total) by mouth daily., Disp: 30 tablet, Rfl: 6 .  potassium chloride SA (KLOR-CON M20) 20 MEQ tablet, Klor-Con M20 mEq tablet,extended release, Disp: , Rfl:  .  predniSONE (DELTASONE) 20 MG tablet, prednisone 20 mg tablet, Disp: , Rfl:  .  QUEtiapine (SEROQUEL) 50 MG tablet, Take 3 tablets (150 mg total) by mouth at bedtime., Disp: 90 tablet, Rfl: 0 .  sertraline (ZOLOFT) 100 MG tablet, sertraline 100 mg tablet  take 1 tablet by mouth every morning, Disp: , Rfl:  .  SYNTHROID 150 MCG tablet, Take 1 tablet (150 mcg total) by mouth daily before breakfast., Disp: 90 tablet, Rfl: 3 .  valsartan-hydrochlorothiazide (DIOVAN-HCT) 160-12.5 MG tablet, Take 1 tablet by mouth daily., Disp: 90 tablet, Rfl: 1  Social History   Tobacco Use  Smoking Status Never Smoker  Smokeless Tobacco Never Used    No Known Allergies Objective:  There were no vitals filed for this visit. There is no height or weight on file to calculate BMI. Constitutional Well developed. Well nourished.  Vascular Dorsalis pedis pulses present 2+ bilaterally  Posterior tibial pulses barely palpable bilaterally  Pedal hair growth diminished. Capillary refill normal to all digits.  No cyanosis or clubbing noted.  Neurologic Normal speech. Oriented to person, place, and time. Epicritic sensation to light touch grossly present bilaterally. Protective sensation with 5.07 monofilament  present bilaterally. Vibratory sensation present bilaterally.  Dermatologic Nails elongated,  thickened, dystrophic. No open wounds. No skin lesions.  Orthopedic: Normal joint ROM without pain or crepitus bilaterally. Adductorvarus 5th toe bilat with POP R 5th toe   Assessment:   1. Diabetes mellitus type 2 with peripheral artery disease ([place])   2. Onychomycosis   3. Acquired adductovarus rotation of toe, right   4. Hammertoe of right foot   5. Toe pain, right   6. Toe pain, left   7. Callus of foot    Plan:  Patient was evaluated and treated and all questions answered.  Diabetes with PAD, Onychomycosis -Educated on diabetic footcare. Diabetic risk level 1 -Nails x10 debrided sharply and manually with large nail nipper and rotary burr.   Procedure: Nail Debridement Rationale: Patient meets criteria for routine foot care due to PAD Type of Debridement: manual, sharp debridement. Instrumentation: Nail nipper, rotary burr. Number of Nails: 10   Procedure: Paring of Lesion Rationale: painful hyperkeratotic lesion Type of Debridement: manual, sharp debridement. Instrumentation: 312 blade Number of Lesions: 2   Adductovarus Hammertoe R 5th Toe  -Due to continued calus formation would benefit from correction before the area ulcerates. Discussed benefit of correction. Patient wishes to proceed. -Patient has failed all conservative therapy and wishes to proceed with surgical intervention. All risks, benefits, and alternatives discussed with patient. No guarantees given. Consent reviewed and signed by patient. -Planned procedures: R 5th Hammertoe correction.  -Increased risk due to DM, PAD. I do believe her pulses are sufficient for healing. -Will need PCP clearance prior to surgery with A1c <8.0.  Return for post op.

## 2018-05-24 NOTE — Patient Instructions (Signed)
Pre-Operative Instructions  Congratulations, you have decided to take an important step towards improving your quality of life.  You can be assured that the doctors and staff at [facility] will be with you every step of the way.  Here are some important things you should know:  1. Plan to be at the surgery center/hospital at least 1 (one) hour prior to your scheduled time, unless otherwise directed by the surgical center/hospital staff.  You must have a responsible adult accompany you, remain during the surgery and drive you home.  Make sure you have directions to the surgical center/hospital to ensure you arrive on time. 2. If you are having surgery at [facility] or [facility], you will need a copy of your medical history and physical form from your family physician within one month prior to the date of surgery. We will give you a form for your primary physician to complete.  3. We make every effort to accommodate the date you request for surgery.  However, there are times where surgery dates or times have to be moved.  We will contact you as soon as possible if a change in schedule is required.   4. No aspirin/ibuprofen for one week before surgery.  If you are on aspirin, any non-steroidal anti-inflammatory medications (Mobic, Aleve, Ibuprofen) should not be taken seven (7) days prior to your surgery.  You make take Tylenol for pain prior to surgery.  5. Medications - If you are taking daily heart and blood pressure medications, seizure, reflux, allergy, asthma, anxiety, pain or diabetes medications, make sure you notify the surgery center/hospital before the day of surgery so they can tell you which medications you should take or avoid the day of surgery. 6. No food or drink after midnight the night before surgery unless directed otherwise by surgical center/hospital staff. 7. No alcoholic beverages 24-hours prior to surgery.  No smoking 24-hours prior or 24-hours after  surgery. 8. Wear loose pants or shorts. They should be loose enough to fit over bandages, boots, and casts. 9. Don't wear slip-on shoes. Sneakers are preferred. 10. Bring your boot with you to the surgery center/hospital.  Also bring crutches or a walker if your physician has prescribed it for you.  If you do not have this equipment, it will be provided for you after surgery. 11. If you have not been contacted by the surgery center/hospital by the day before your surgery, call to confirm the date and time of your surgery. 12. Leave-time from work may vary depending on the type of surgery you have.  Appropriate arrangements should be made prior to surgery with your employer. 13. Prescriptions will be provided immediately following surgery by your doctor.  Fill these as soon as possible after surgery and take the medication as directed. Pain medications will not be refilled on weekends and must be approved by the doctor. 14. Remove nail polish on the operative foot and avoid getting pedicures prior to surgery. 15. Wash the night before surgery.  The night before surgery wash the foot and leg well with water and the antibacterial soap provided. Be sure to pay special attention to beneath the toenails and in between the toes.  Wash for at least three (3) minutes. Rinse thoroughly with water and dry well with a towel.  Perform this wash unless told not to do so by your physician.  Enclosed: 1 Ice pack (please put in freezer the night before surgery)   1 Hibiclens skin cleaner     Pre-op instructions  If you have any questions regarding the instructions, please do not hesitate to call our office.  [place]: [address], , [place] [address] -- [phone]  [place]: [address] [place], [place] [address] -- [phone]  [place]: [address]  [place], [place] [address] -- [phone]  [place]: [address], [address], [place],  [address] -- [phone]  Website:  [email or web address] 

## 2018-06-01 DIAGNOSIS — Z23 Encounter for immunization: Secondary | ICD-10-CM | POA: Diagnosis not present

## 2018-06-04 ENCOUNTER — Telehealth: Payer: Self-pay | Admitting: *Deleted

## 2018-06-04 NOTE — Telephone Encounter (Signed)
"  I'm calling to cancel my surgery that's scheduled for November 6."  Do you want to reschedule it?  "I will call back at the beginning of the year.  I want to wait until after the Holidays."  I will let Dr. March Rummage know and I will call the surgical center and let Caren Griffins know as well.  "Okay, thank you."  I called and informed Caren Griffins about the cancellation and I canceled it in [facility] [employer].

## 2018-06-15 ENCOUNTER — Other Ambulatory Visit: Payer: Medicare Other

## 2018-06-26 DIAGNOSIS — F419 Anxiety disorder, unspecified: Secondary | ICD-10-CM | POA: Diagnosis not present

## 2018-06-26 DIAGNOSIS — F310 Bipolar disorder, current episode hypomanic: Secondary | ICD-10-CM | POA: Diagnosis not present

## 2018-07-11 ENCOUNTER — Other Ambulatory Visit: Payer: Self-pay | Admitting: Family Medicine

## 2018-07-11 ENCOUNTER — Other Ambulatory Visit (HOSPITAL_COMMUNITY)
Admission: RE | Admit: 2018-07-11 | Discharge: 2018-07-11 | Disposition: A | Discharge status: Home / Self Care | Payer: Medicare Other | Source: Ambulatory Visit | Attending: Family Medicine | Admitting: Family Medicine

## 2018-07-11 DIAGNOSIS — E119 Type 2 diabetes mellitus without complications: Secondary | ICD-10-CM | POA: Diagnosis not present

## 2018-07-11 DIAGNOSIS — Z0000 Encounter for general adult medical examination without abnormal findings: Secondary | ICD-10-CM | POA: Diagnosis not present

## 2018-07-11 DIAGNOSIS — Z136 Encounter for screening for cardiovascular disorders: Secondary | ICD-10-CM | POA: Diagnosis not present

## 2018-07-11 DIAGNOSIS — Z124 Encounter for screening for malignant neoplasm of cervix: Secondary | ICD-10-CM | POA: Diagnosis not present

## 2018-07-11 DIAGNOSIS — I10 Essential (primary) hypertension: Secondary | ICD-10-CM | POA: Diagnosis not present

## 2018-07-11 DIAGNOSIS — L304 Erythema intertrigo: Secondary | ICD-10-CM | POA: Diagnosis not present

## 2018-07-11 DIAGNOSIS — Z01411 Encounter for gynecological examination (general) (routine) with abnormal findings: Secondary | ICD-10-CM | POA: Diagnosis not present

## 2018-07-11 DIAGNOSIS — Z1211 Encounter for screening for malignant neoplasm of colon: Secondary | ICD-10-CM | POA: Diagnosis not present

## 2018-07-11 DIAGNOSIS — Z13220 Encounter for screening for lipoid disorders: Secondary | ICD-10-CM | POA: Diagnosis not present

## 2018-07-11 DIAGNOSIS — E039 Hypothyroidism, unspecified: Secondary | ICD-10-CM | POA: Diagnosis not present

## 2018-07-11 DIAGNOSIS — Z6841 Body Mass Index (BMI) 40.0 and over, adult: Secondary | ICD-10-CM | POA: Diagnosis not present

## 2018-07-16 LAB — CYTOLOGY - PAP
Diagnosis: NEGATIVE
HPV: DETECTED — AB

## 2018-08-13 DIAGNOSIS — Z1211 Encounter for screening for malignant neoplasm of colon: Secondary | ICD-10-CM | POA: Diagnosis not present

## 2018-08-14 ENCOUNTER — Other Ambulatory Visit: Payer: Self-pay | Admitting: Family Medicine

## 2018-08-14 DIAGNOSIS — Z1231 Encounter for screening mammogram for malignant neoplasm of breast: Secondary | ICD-10-CM

## 2018-09-12 ENCOUNTER — Ambulatory Visit
Admission: RE | Admit: 2018-09-12 | Discharge: 2018-09-12 | Disposition: A | Discharge status: Home / Self Care | Payer: Medicare Other | Source: Ambulatory Visit | Attending: Family Medicine | Admitting: Family Medicine

## 2018-09-12 DIAGNOSIS — Z1231 Encounter for screening mammogram for malignant neoplasm of breast: Secondary | ICD-10-CM

## 2018-09-14 DIAGNOSIS — R05 Cough: Secondary | ICD-10-CM | POA: Diagnosis not present

## 2018-10-30 DIAGNOSIS — F419 Anxiety disorder, unspecified: Secondary | ICD-10-CM | POA: Diagnosis not present

## 2019-03-27 DIAGNOSIS — F310 Bipolar disorder, current episode hypomanic: Secondary | ICD-10-CM | POA: Diagnosis not present

## 2019-03-27 DIAGNOSIS — F419 Anxiety disorder, unspecified: Secondary | ICD-10-CM | POA: Diagnosis not present

## 2019-05-10 ENCOUNTER — Other Ambulatory Visit: Payer: Self-pay

## 2019-05-10 DIAGNOSIS — Z20822 Contact with and (suspected) exposure to covid-19: Secondary | ICD-10-CM

## 2019-05-11 LAB — NOVEL CORONAVIRUS, NAA: SARS-CoV-2, NAA: NOT DETECTED

## 2019-05-17 DIAGNOSIS — Z23 Encounter for immunization: Secondary | ICD-10-CM | POA: Diagnosis not present

## 2019-10-02 ENCOUNTER — Other Ambulatory Visit: Payer: Self-pay | Admitting: Family Medicine

## 2019-10-02 DIAGNOSIS — Z1231 Encounter for screening mammogram for malignant neoplasm of breast: Secondary | ICD-10-CM

## 2019-10-28 ENCOUNTER — Ambulatory Visit
Admission: RE | Admit: 2019-10-28 | Discharge: 2019-10-28 | Disposition: A | Discharge status: Home / Self Care | Payer: Medicare Other | Source: Ambulatory Visit | Attending: Family Medicine | Admitting: Family Medicine

## 2019-10-28 ENCOUNTER — Other Ambulatory Visit: Payer: Self-pay

## 2019-10-28 DIAGNOSIS — Z1231 Encounter for screening mammogram for malignant neoplasm of breast: Secondary | ICD-10-CM

## 2019-12-10 DIAGNOSIS — G5600 Carpal tunnel syndrome, unspecified upper limb: Secondary | ICD-10-CM | POA: Insufficient documentation

## 2020-06-06 ENCOUNTER — Ambulatory Visit: Payer: Medicare Other | Attending: Internal Medicine

## 2020-06-06 DIAGNOSIS — Z23 Encounter for immunization: Secondary | ICD-10-CM

## 2020-06-06 NOTE — Progress Notes (Signed)
   Covid-19 Vaccination Clinic  Name:  Taylor Ramirez    MRN: [id number] DOB: October 30, 1955  06/06/2020  [name] was observed post Covid-19 immunization for 15 minutes without incident. She was provided with Vaccine Information Sheet and instruction to access the V-Safe system.   [name] was instructed to call 911 with any severe reactions post vaccine: Marland Kitchen Difficulty breathing  . Swelling of face and throat  . A fast heartbeat  . A bad rash all over body  . Dizziness and weakness

## 2020-09-01 DIAGNOSIS — M62838 Other muscle spasm: Secondary | ICD-10-CM | POA: Diagnosis not present

## 2020-09-01 DIAGNOSIS — I10 Essential (primary) hypertension: Secondary | ICD-10-CM | POA: Diagnosis not present

## 2020-09-17 DIAGNOSIS — H40013 Open angle with borderline findings, low risk, bilateral: Secondary | ICD-10-CM | POA: Diagnosis not present

## 2020-09-22 DIAGNOSIS — R059 Cough, unspecified: Secondary | ICD-10-CM | POA: Diagnosis not present

## 2020-09-23 DIAGNOSIS — R059 Cough, unspecified: Secondary | ICD-10-CM | POA: Diagnosis not present

## 2020-09-29 DIAGNOSIS — I10 Essential (primary) hypertension: Secondary | ICD-10-CM | POA: Diagnosis not present

## 2020-10-20 ENCOUNTER — Other Ambulatory Visit: Payer: Self-pay | Admitting: Family Medicine

## 2020-10-20 DIAGNOSIS — Z1231 Encounter for screening mammogram for malignant neoplasm of breast: Secondary | ICD-10-CM

## 2020-10-23 ENCOUNTER — Ambulatory Visit
Admission: RE | Admit: 2020-10-23 | Discharge: 2020-10-23 | Disposition: A | Discharge status: Home / Self Care | Payer: Medicare Other | Source: Ambulatory Visit | Attending: Family Medicine | Admitting: Family Medicine

## 2020-10-23 ENCOUNTER — Other Ambulatory Visit: Payer: Self-pay

## 2020-10-23 ENCOUNTER — Other Ambulatory Visit: Payer: Self-pay | Admitting: Family Medicine

## 2020-10-23 DIAGNOSIS — R059 Cough, unspecified: Secondary | ICD-10-CM | POA: Diagnosis not present

## 2020-10-23 DIAGNOSIS — R053 Chronic cough: Secondary | ICD-10-CM

## 2020-11-11 ENCOUNTER — Other Ambulatory Visit: Payer: Self-pay

## 2020-11-11 ENCOUNTER — Ambulatory Visit
Admission: RE | Admit: 2020-11-11 | Discharge: 2020-11-11 | Disposition: A | Discharge status: Home / Self Care | Payer: Medicare Other | Source: Ambulatory Visit | Attending: Family Medicine | Admitting: Family Medicine

## 2020-11-11 DIAGNOSIS — Z1231 Encounter for screening mammogram for malignant neoplasm of breast: Secondary | ICD-10-CM

## 2020-11-26 DIAGNOSIS — R21 Rash and other nonspecific skin eruption: Secondary | ICD-10-CM | POA: Diagnosis not present

## 2020-11-26 DIAGNOSIS — R0602 Shortness of breath: Secondary | ICD-10-CM | POA: Diagnosis not present

## 2020-11-26 DIAGNOSIS — L83 Acanthosis nigricans: Secondary | ICD-10-CM | POA: Diagnosis not present

## 2021-02-11 DIAGNOSIS — H35361 Drusen (degenerative) of macula, right eye: Secondary | ICD-10-CM | POA: Diagnosis not present

## 2021-02-11 DIAGNOSIS — H43391 Other vitreous opacities, right eye: Secondary | ICD-10-CM | POA: Diagnosis not present

## 2021-05-28 DIAGNOSIS — Z23 Encounter for immunization: Secondary | ICD-10-CM | POA: Diagnosis not present

## 2021-05-28 DIAGNOSIS — E785 Hyperlipidemia, unspecified: Secondary | ICD-10-CM | POA: Diagnosis not present

## 2021-05-28 DIAGNOSIS — E1169 Type 2 diabetes mellitus with other specified complication: Secondary | ICD-10-CM | POA: Diagnosis not present

## 2021-05-28 DIAGNOSIS — L309 Dermatitis, unspecified: Secondary | ICD-10-CM | POA: Diagnosis not present

## 2021-05-28 DIAGNOSIS — H6120 Impacted cerumen, unspecified ear: Secondary | ICD-10-CM | POA: Diagnosis not present

## 2021-07-06 DIAGNOSIS — J4541 Moderate persistent asthma with (acute) exacerbation: Secondary | ICD-10-CM | POA: Diagnosis not present

## 2021-07-06 DIAGNOSIS — J00 Acute nasopharyngitis [common cold]: Secondary | ICD-10-CM | POA: Diagnosis not present

## 2021-07-26 DIAGNOSIS — E1169 Type 2 diabetes mellitus with other specified complication: Secondary | ICD-10-CM | POA: Diagnosis not present

## 2021-07-26 DIAGNOSIS — Z1211 Encounter for screening for malignant neoplasm of colon: Secondary | ICD-10-CM | POA: Diagnosis not present

## 2021-07-26 DIAGNOSIS — E039 Hypothyroidism, unspecified: Secondary | ICD-10-CM | POA: Diagnosis not present

## 2021-07-26 DIAGNOSIS — J309 Allergic rhinitis, unspecified: Secondary | ICD-10-CM | POA: Diagnosis not present

## 2021-07-26 DIAGNOSIS — E785 Hyperlipidemia, unspecified: Secondary | ICD-10-CM | POA: Diagnosis not present

## 2021-07-26 DIAGNOSIS — I10 Essential (primary) hypertension: Secondary | ICD-10-CM | POA: Diagnosis not present

## 2021-07-26 DIAGNOSIS — Z0000 Encounter for general adult medical examination without abnormal findings: Secondary | ICD-10-CM | POA: Diagnosis not present

## 2021-07-26 DIAGNOSIS — M1990 Unspecified osteoarthritis, unspecified site: Secondary | ICD-10-CM | POA: Diagnosis not present

## 2021-07-26 DIAGNOSIS — Z7984 Long term (current) use of oral hypoglycemic drugs: Secondary | ICD-10-CM | POA: Diagnosis not present

## 2021-08-19 ENCOUNTER — Other Ambulatory Visit: Payer: Self-pay | Admitting: Family Medicine

## 2021-08-19 ENCOUNTER — Other Ambulatory Visit (HOSPITAL_COMMUNITY)
Admission: RE | Admit: 2021-08-19 | Discharge: 2021-08-19 | Disposition: A | Discharge status: Home / Self Care | Payer: Medicare Other | Source: Ambulatory Visit | Attending: Family Medicine | Admitting: Family Medicine

## 2021-08-19 DIAGNOSIS — Z1151 Encounter for screening for human papillomavirus (HPV): Secondary | ICD-10-CM | POA: Insufficient documentation

## 2021-08-19 DIAGNOSIS — Z01411 Encounter for gynecological examination (general) (routine) with abnormal findings: Secondary | ICD-10-CM | POA: Diagnosis present

## 2021-08-23 LAB — CYTOLOGY - PAP
Adequacy: ABSENT
Comment: NEGATIVE
Diagnosis: NEGATIVE
High risk HPV: NEGATIVE

## 2021-09-29 DIAGNOSIS — H40013 Open angle with borderline findings, low risk, bilateral: Secondary | ICD-10-CM | POA: Diagnosis not present

## 2021-09-29 DIAGNOSIS — H43392 Other vitreous opacities, left eye: Secondary | ICD-10-CM | POA: Diagnosis not present

## 2021-09-29 DIAGNOSIS — E119 Type 2 diabetes mellitus without complications: Secondary | ICD-10-CM | POA: Diagnosis not present

## 2021-09-29 DIAGNOSIS — H25813 Combined forms of age-related cataract, bilateral: Secondary | ICD-10-CM | POA: Diagnosis not present

## 2021-09-29 DIAGNOSIS — H524 Presbyopia: Secondary | ICD-10-CM | POA: Diagnosis not present

## 2021-10-18 ENCOUNTER — Other Ambulatory Visit: Payer: Self-pay | Admitting: Family Medicine

## 2021-10-18 DIAGNOSIS — Z1231 Encounter for screening mammogram for malignant neoplasm of breast: Secondary | ICD-10-CM

## 2021-11-15 ENCOUNTER — Ambulatory Visit: Payer: Medicare Other

## 2021-11-26 ENCOUNTER — Ambulatory Visit
Admission: RE | Admit: 2021-11-26 | Discharge: 2021-11-26 | Disposition: A | Discharge status: Home / Self Care | Payer: Medicare Other | Source: Ambulatory Visit | Attending: Family Medicine | Admitting: Family Medicine

## 2021-11-26 DIAGNOSIS — Z1231 Encounter for screening mammogram for malignant neoplasm of breast: Secondary | ICD-10-CM | POA: Diagnosis not present

## 2022-01-10 DIAGNOSIS — R2243 Localized swelling, mass and lump, lower limb, bilateral: Secondary | ICD-10-CM | POA: Diagnosis not present

## 2022-01-10 DIAGNOSIS — E039 Hypothyroidism, unspecified: Secondary | ICD-10-CM | POA: Diagnosis not present

## 2022-01-10 DIAGNOSIS — R0989 Other specified symptoms and signs involving the circulatory and respiratory systems: Secondary | ICD-10-CM | POA: Diagnosis not present

## 2022-01-10 DIAGNOSIS — I10 Essential (primary) hypertension: Secondary | ICD-10-CM | POA: Diagnosis not present

## 2022-01-11 ENCOUNTER — Other Ambulatory Visit: Payer: Self-pay | Admitting: Family Medicine

## 2022-01-11 DIAGNOSIS — R0989 Other specified symptoms and signs involving the circulatory and respiratory systems: Secondary | ICD-10-CM

## 2022-01-11 DIAGNOSIS — R600 Localized edema: Secondary | ICD-10-CM

## 2022-01-14 ENCOUNTER — Other Ambulatory Visit: Payer: Medicare Other

## 2022-01-19 ENCOUNTER — Ambulatory Visit
Admission: RE | Admit: 2022-01-19 | Discharge: 2022-01-19 | Disposition: A | Discharge status: Home / Self Care | Payer: Medicare Other | Source: Ambulatory Visit | Attending: Family Medicine | Admitting: Family Medicine

## 2022-01-19 DIAGNOSIS — I998 Other disorder of circulatory system: Secondary | ICD-10-CM | POA: Diagnosis not present

## 2022-01-19 DIAGNOSIS — R0989 Other specified symptoms and signs involving the circulatory and respiratory systems: Secondary | ICD-10-CM

## 2022-01-19 DIAGNOSIS — R600 Localized edema: Secondary | ICD-10-CM | POA: Diagnosis not present

## 2022-01-24 DIAGNOSIS — E039 Hypothyroidism, unspecified: Secondary | ICD-10-CM | POA: Diagnosis not present

## 2022-01-24 DIAGNOSIS — R600 Localized edema: Secondary | ICD-10-CM | POA: Diagnosis not present

## 2022-01-24 DIAGNOSIS — M79671 Pain in right foot: Secondary | ICD-10-CM | POA: Diagnosis not present

## 2022-01-24 DIAGNOSIS — I10 Essential (primary) hypertension: Secondary | ICD-10-CM | POA: Diagnosis not present

## 2022-02-02 ENCOUNTER — Ambulatory Visit (INDEPENDENT_AMBULATORY_CARE_PROVIDER_SITE_OTHER): Payer: Medicare Other

## 2022-02-02 ENCOUNTER — Ambulatory Visit (INDEPENDENT_AMBULATORY_CARE_PROVIDER_SITE_OTHER): Payer: Medicare Other | Admitting: Podiatry

## 2022-02-02 DIAGNOSIS — M7661 Achilles tendinitis, right leg: Secondary | ICD-10-CM | POA: Diagnosis not present

## 2022-02-02 DIAGNOSIS — J4541 Moderate persistent asthma with (acute) exacerbation: Secondary | ICD-10-CM | POA: Insufficient documentation

## 2022-02-02 DIAGNOSIS — L98499 Non-pressure chronic ulcer of skin of other sites with unspecified severity: Secondary | ICD-10-CM | POA: Insufficient documentation

## 2022-02-02 DIAGNOSIS — B977 Papillomavirus as the cause of diseases classified elsewhere: Secondary | ICD-10-CM | POA: Insufficient documentation

## 2022-02-02 DIAGNOSIS — L83 Acanthosis nigricans: Secondary | ICD-10-CM | POA: Insufficient documentation

## 2022-02-02 DIAGNOSIS — B354 Tinea corporis: Secondary | ICD-10-CM | POA: Insufficient documentation

## 2022-02-02 DIAGNOSIS — G8929 Other chronic pain: Secondary | ICD-10-CM | POA: Insufficient documentation

## 2022-02-02 DIAGNOSIS — Z6841 Body Mass Index (BMI) 40.0 and over, adult: Secondary | ICD-10-CM | POA: Insufficient documentation

## 2022-02-02 DIAGNOSIS — E785 Hyperlipidemia, unspecified: Secondary | ICD-10-CM | POA: Insufficient documentation

## 2022-02-05 ENCOUNTER — Encounter: Payer: Self-pay | Admitting: Podiatry

## 2022-02-05 NOTE — Progress Notes (Signed)
  Subjective:  Patient ID: Taylor Ramirez, female    DOB: 1956/06/08,  MRN: [id number]  Chief Complaint  Patient presents with   Diabetes   Peripheral Neuropathy   Foot Pain    66 y.o. female presents with the above complaint. History confirmed with patient.  Most of her pain is in the right posterior heel, left is bothersome to  Objective:  Physical Exam: warm, good capillary refill, no trophic changes or ulcerative lesions, normal DP and PT pulses, and normal sensory exam. Left Foot: normal exam, no swelling, tenderness, instability; ligaments intact, full range of motion of all ankle/foot joints Right Foot: tenderness at Achilles tendon insertion and gastrocnemius equinus is noted with a positive silverskiold test  No images are attached to the encounter.  Radiographs: Multiple views x-ray of the right foot: no fracture, dislocation, swelling or degenerative changes noted and posterior calcaneal spur Assessment:   1. Tendonitis, Achilles, right      Plan:  Patient was evaluated and treated and all questions answered.  Discussed the etiology and treatment options for Achilles tendinitis including stretching, formal physical therapy with an eccentric exercises therapy plan, supportive shoegears such as a running shoe or sneaker, heel lifts, topical and oral medications.  We also discussed that I do not routinely perform injections in this area because of the risk of an increased damage or rupture of the tendon.  We also discussed the role of surgical treatment of this for patients who do not improve after exhausting non-surgical treatment options.  -XR reviewed with patient -Educated on stretching and icing of the affected limb. -Referral placed to physical therapy. -Rest and immobilization in a cam boot -OTC Voltaren gel   Return in about 6 weeks (around 03/16/2022).

## 2022-02-14 DIAGNOSIS — I10 Essential (primary) hypertension: Secondary | ICD-10-CM | POA: Diagnosis not present

## 2022-02-14 DIAGNOSIS — M79671 Pain in right foot: Secondary | ICD-10-CM | POA: Diagnosis not present

## 2022-02-14 DIAGNOSIS — R609 Edema, unspecified: Secondary | ICD-10-CM | POA: Diagnosis not present

## 2022-03-10 ENCOUNTER — Ambulatory Visit (INDEPENDENT_AMBULATORY_CARE_PROVIDER_SITE_OTHER): Payer: Medicare Other | Admitting: Podiatry

## 2022-03-10 DIAGNOSIS — M7661 Achilles tendinitis, right leg: Secondary | ICD-10-CM | POA: Diagnosis not present

## 2022-03-10 NOTE — Patient Instructions (Signed)
Call to schedule physical therapy: [facility] at [place] [address]  [phone]  

## 2022-03-13 NOTE — Progress Notes (Signed)
  Subjective:  Patient ID: Taylor Ramirez, female    DOB: 1955/09/18,  MRN: [id number]  Chief Complaint  Patient presents with   Tendonitis    Achilles /  Right foot pain 6 week follow up - doing much better. She bought new walking shoes that really help a lot.    66 y.o. female presents with the above complaint. History confirmed with patient.  Overall doing little bit better but still painful probably at 50%.  Objective:  Physical Exam: warm, good capillary refill, no trophic changes or ulcerative lesions, normal DP and PT pulses, and normal sensory exam. Left Foot: normal exam, no swelling, tenderness, instability; ligaments intact, full range of motion of all ankle/foot joints Right Foot: Improved but still has some tenderness at Achilles tendon insertion and gastrocnemius equinus is noted with a positive silverskiold test  No images are attached to the encounter.  Radiographs: Multiple views x-ray of the right foot: no fracture, dislocation, swelling or degenerative changes noted and posterior calcaneal spur Assessment:   1. Tendonitis, Achilles, right      Plan:  Patient was evaluated and treated and all questions answered.  Discussed the etiology and treatment options for Achilles tendinitis including stretching, formal physical therapy with an eccentric exercises therapy plan, supportive shoegears such as a running shoe or sneaker, heel lifts, topical and oral medications.  We also discussed that I do not routinely perform injections in this area because of the risk of an increased damage or rupture of the tendon.  We also discussed the role of surgical treatment of this for patients who do not improve after exhausting non-surgical treatment options.   Has had some improvement probably about 50%.  I recommended we increase her physical therapy to formal physical therapy here at [facility].  Referral was sent.  I will see her back in 2 months for follow-up hopefully should be  100% by then   Return in about 2 months (around 05/10/2022) for re-check Achilles tendon.

## 2022-03-17 ENCOUNTER — Ambulatory Visit: Payer: Medicare Other | Admitting: Podiatry

## 2022-03-25 ENCOUNTER — Ambulatory Visit: Payer: Medicare Other

## 2022-03-29 DIAGNOSIS — E119 Type 2 diabetes mellitus without complications: Secondary | ICD-10-CM | POA: Diagnosis not present

## 2022-03-29 DIAGNOSIS — H401132 Primary open-angle glaucoma, bilateral, moderate stage: Secondary | ICD-10-CM | POA: Diagnosis not present

## 2022-04-04 NOTE — Therapy (Deleted)
OUTPATIENT PHYSICAL THERAPY LOWER EXTREMITY EVALUATION   Patient Name: Taylor Ramirez MRN: [id number] DOB:01/18/1956, 66 y.o., female Today's Date: 04/04/2022    Past Medical History:  Diagnosis Date   Allergic rhinitis    Anemia    h/o   Anxiety    Asthma    Pulmonary function Tests 09-2006: mild asthma   Diabetes mellitus    Heart murmur    ECHO 5/10 was neg   Hypertension    Hypothyroidism    Premature surgical menopause on HRT    after hysterectomy 2011   Psychosis ([place]) 05/16/2014   Schizophrenia ([place])    Vaginal discharge 03/2015   Vaginal infection 03/2015   Past Surgical History:  Procedure Laterality Date   ABDOMINAL HYSTERECTOMY  2011   no oophorectomy   TONSILLECTOMY     Patient Active Problem List   Diagnosis Date Noted   Acanthosis nigricans 02/02/2022   Body mass index (BMI) 50.0-59.9, adult ([place]) 02/02/2022   Chronic pain 02/02/2022   Hyperlipidemia 02/02/2022   Moderate persistent asthma with (acute) exacerbation 02/02/2022   Morbid obesity ([place]) 02/02/2022   Papillomavirus as the cause of diseases classified elsewhere 02/02/2022   Skin ulcer ([place]) 02/02/2022   Tinea corporis 02/02/2022   Carpal tunnel syndrome 12/10/2019   Vaginitis 12/22/2017   Candidal vulvovaginitis 12/22/2017   Asthma 12/22/2017   Altered mental status    PCP NOTES >>>>>> 07/23/2015   Cardiac murmur 11/12/2014   Abnormal LFTs 05/22/2014   Decreased potassium in the blood 05/20/2014   Schizophrenia, unspecified ([place]) 05/16/2014   DOE (dyspnea on exertion) 03/01/2013   Back sprain 01/17/2012   Foot pain 12/02/2011   General medical examination 07/04/2011   DJD (degenerative joint disease) 07/04/2011   Hypothyroidism 11/28/2006   DMII (diabetes mellitus, type 2) ([place]) 11/28/2006   ANEMIA-NOS 11/28/2006   HTN (hypertension) 11/28/2006   Allergic rhinitis 11/28/2006   ASTHMA 11/28/2006    PCP: Dorothyann Peng, NP  REFERRING PROVIDER: Criselda Peaches,  DPM   REFERRING DIAG: M76.61 (ICD-10-CM) - Tendonitis, Achilles, right   THERAPY DIAG: Tendonitis, Achilles, B  Rationale for Evaluation and Treatment Rehabilitation  ONSET DATE: ***  SUBJECTIVE:   SUBJECTIVE STATEMENT: ***  PERTINENT HISTORY: Discussed the etiology and treatment options for Achilles tendinitis including stretching, formal physical therapy with an eccentric exercises therapy plan, supportive shoegears such as a running shoe or sneaker, heel lifts, topical and oral medications.  We also discussed that I do not routinely perform injections in this area because of the risk of an increased damage or rupture of the tendon.  We also discussed the role of surgical treatment of this for patients who do not improve after exhausting non-surgical treatment options.     Has had some improvement probably about 50%.  I recommended we increase her physical therapy to formal physical therapy here at [facility].  Referral was sent.  I will see her back in 2 months for follow-up hopefully should be 100% by then     Return in about 2 months (around 05/10/2022) for re-check Achilles tendon.   PAIN:  Are you having pain? {OPRCPAIN:27236}  PRECAUTIONS: None  WEIGHT BEARING RESTRICTIONS No  FALLS:  Has patient fallen in last 6 months? No  LIVING ENVIRONMENT: Lives with: lives with their family Lives in: House/apartment Stairs:  yes Has following equipment at home: {Assistive devices:23999}  OCCUPATION: ***  PLOF: {PLOF:24004}  PATIENT GOALS ***   OBJECTIVE:   DIAGNOSTIC FINDINGS: Radiographs: Multiple views x-ray of the  right foot: no fracture, dislocation, swelling or degenerative changes noted and posterior calcaneal spur  PATIENT SURVEYS:  FOTO ***  COGNITION:  Overall cognitive status: Within functional limits for tasks assessed     SENSATION: Not tested  MUSCLE LENGTH: Hamstrings: Right *** deg; Left *** deg Marcello Moores test: Right *** deg; Left *** deg  POSTURE:  {posture:25561}  PALPATION: ***  LOWER EXTREMITY ROM:  {AROM/PROM:27142} ROM Right eval Left eval  Hip flexion    Hip extension    Hip abduction    Hip adduction    Hip internal rotation    Hip external rotation    Knee flexion    Knee extension    Ankle dorsiflexion    Ankle plantarflexion    Ankle inversion    Ankle eversion     (Blank rows = not tested)  LOWER EXTREMITY MMT:  MMT Right eval Left eval  Hip flexion    Hip extension    Hip abduction    Hip adduction    Hip internal rotation    Hip external rotation    Knee flexion    Knee extension    Ankle dorsiflexion    Ankle plantarflexion    Ankle inversion    Ankle eversion     (Blank rows = not tested)  LOWER EXTREMITY SPECIAL TESTS:  {LEspecialtests:26242}  FUNCTIONAL TESTS:  {Functional tests:24029}  GAIT: Distance walked: *** Assistive device utilized: {Assistive devices:23999} Level of assistance: {Levels of assistance:24026} Comments: ***    TODAY'S TREATMENT: ***   PATIENT EDUCATION:  Education details: *** Person educated: {Person educated:25204} Education method: {Education Method:25205} Education comprehension: {Education Comprehension:25206}   HOME EXERCISE PROGRAM: ***  ASSESSMENT:  CLINICAL IMPRESSION: Patient is a *** y.o. *** who was seen today for physical therapy evaluation and treatment for ***.    OBJECTIVE IMPAIRMENTS {opptimpairments:25111}.   ACTIVITY LIMITATIONS {activitylimitations:27494}  PARTICIPATION LIMITATIONS: {participationrestrictions:25113}  PERSONAL FACTORS {Personal factors:25162} are also affecting patient's functional outcome.   REHAB POTENTIAL: {rehabpotential:25112}  CLINICAL DECISION MAKING: {clinical decision making:25114}  EVALUATION COMPLEXITY: {Evaluation complexity:25115}   GOALS: Goals reviewed with patient? {yes/no:20286}  SHORT TERM GOALS: Target date: {follow up:25551}  *** Baseline: Goal status:  {GOALSTATUS:25110}  2.  *** Baseline:  Goal status: {GOALSTATUS:25110}  3.  *** Baseline:  Goal status: {GOALSTATUS:25110}  4.  *** Baseline:  Goal status: {GOALSTATUS:25110}  5.  *** Baseline:  Goal status: {GOALSTATUS:25110}  6.  *** Baseline:  Goal status: {GOALSTATUS:25110}  LONG TERM GOALS: Target date: {follow up:25551}   *** Baseline:  Goal status: {GOALSTATUS:25110}  2.  *** Baseline:  Goal status: {GOALSTATUS:25110}  3.  *** Baseline:  Goal status: {GOALSTATUS:25110}  4.  *** Baseline:  Goal status: {GOALSTATUS:25110}  5.  *** Baseline:  Goal status: {GOALSTATUS:25110}  6.  *** Baseline:  Goal status: {GOALSTATUS:25110}   PLAN: PT FREQUENCY: {rehab frequency:25116}  PT DURATION: {rehab duration:25117}  PLANNED INTERVENTIONS: {rehab planned interventions:25118::"Therapeutic exercises","Therapeutic activity","Neuromuscular re-education","Balance training","Gait training","Patient/Family education","Self Care","Joint mobilization"}  PLAN FOR NEXT SESSION: Lanice Shirts, PT 04/04/2022, 12:34 PM

## 2022-04-05 ENCOUNTER — Ambulatory Visit: Payer: Medicare Other

## 2022-04-06 NOTE — Therapy (Signed)
OUTPATIENT PHYSICAL THERAPY LOWER EXTREMITY EVALUATION   Patient Name: Taylor Ramirez MRN: [id number] DOB:01-Oct-1955, 66 y.o., female Today's Date: 04/07/2022   PT End of Session - 04/07/22 0947     Visit Number 1    Number of Visits --   1-2x/week   Date for PT Re-Evaluation 06/02/22    Authorization Type UHC MCR    PT Start Time 7209    PT Stop Time 0942    PT Time Calculation (min) 27 min             Past Medical History:  Diagnosis Date   Allergic rhinitis    Anemia    h/o   Anxiety    Asthma    Pulmonary function Tests 09-2006: mild asthma   Diabetes mellitus    Heart murmur    ECHO 5/10 was neg   Hypertension    Hypothyroidism    Premature surgical menopause on HRT    after hysterectomy 2011   Psychosis ([place]) 05/16/2014   Schizophrenia ([place])    Vaginal discharge 03/2015   Vaginal infection 03/2015   Past Surgical History:  Procedure Laterality Date   ABDOMINAL HYSTERECTOMY  2011   no oophorectomy   TONSILLECTOMY     Patient Active Problem List   Diagnosis Date Noted   Acanthosis nigricans 02/02/2022   Body mass index (BMI) 50.0-59.9, adult ([place]) 02/02/2022   Chronic pain 02/02/2022   Hyperlipidemia 02/02/2022   Moderate persistent asthma with (acute) exacerbation 02/02/2022   Morbid obesity ([place]) 02/02/2022   Papillomavirus as the cause of diseases classified elsewhere 02/02/2022   Skin ulcer ([place]) 02/02/2022   Tinea corporis 02/02/2022   Carpal tunnel syndrome 12/10/2019   Vaginitis 12/22/2017   Candidal vulvovaginitis 12/22/2017   Asthma 12/22/2017   Altered mental status    PCP NOTES >>>>>> 07/23/2015   Cardiac murmur 11/12/2014   Abnormal LFTs 05/22/2014   Decreased potassium in the blood 05/20/2014   Schizophrenia, unspecified ([place]) 05/16/2014   DOE (dyspnea on exertion) 03/01/2013   Back sprain 01/17/2012   Foot pain 12/02/2011   General medical examination 07/04/2011   DJD (degenerative joint disease) 07/04/2011    Hypothyroidism 11/28/2006   DMII (diabetes mellitus, type 2) ([place]) 11/28/2006   ANEMIA-NOS 11/28/2006   HTN (hypertension) 11/28/2006   Allergic rhinitis 11/28/2006   ASTHMA 11/28/2006    PCP: Dorothyann Peng, NP  REFERRING PROVIDER: Criselda Peaches, DPM  THERAPY DIAG:  Pain in right ankle and joints of right foot - Plan: PT plan of care cert/re-cert  Muscle weakness - Plan: PT plan of care cert/re-cert  Unsteadiness on feet - Plan: PT plan of care cert/re-cert  Localized edema - Plan: PT plan of care cert/re-cert  REFERRING DIAG: Tendonitis, Achilles, right [M76.61]  Rationale for Evaluation and Treatment Rehabilitation  SUBJECTIVE:  PERTINENT PAST HISTORY:  Asthma, DM TII, schizophrenia       PRECAUTIONS: None  WEIGHT BEARING RESTRICTIONS No  FALLS:  Has patient fallen in last 6 months? No, Number of falls: 0  MOI/History of condition:  Onset date: May 2023  Taylor Ramirez is a 66 y.o. female who presents to clinic with chief complaint of R achilles pain.  No inciting injury.  Slow onset.  She is wearing a night splint.  "I have good days and bad days"  From referring provider:   "Has had some improvement probably about 50%.  I recommended we increase her physical therapy to formal physical therapy here at [facility].  Referral  was sent.  I will see her back in 2 months for follow-up hopefully should be 100% by then"   Red flags:  denies   Pain:  Are you having pain? No Pain location: R achilles tendon NPRS scale:  current 0/10  average 8/10  Aggravating factors: walking, steps  NPRS, highest: 10/10 Relieving factors: rest  NPRS: best: 0/10 Pain description: intermittent, sharp, and dull Stage: Chronic Stability: staying the same 24 hour pattern: worse with activity   Occupation: retired  Administrator, sports: NA  Hand Dominance: NA  Patient Goals/Specific Activities: "get ankle stretched out and less painful"   OBJECTIVE:   DIAGNOSTIC  FINDINGS:  NA   GENERAL OBSERVATION/GAIT:   Slow antalgic gait, reduced tim in stance R, forward flexed posture in gait  SENSATION:  Light touch: Appears intact  PALPATION: TTP R achilles insertion; mild/moderate edema surrounding achilles insertion  LE MMT:  MMT Right 04/07/2022 Left 04/07/2022  Hip flexion (L2, L3) 3+ 3+  Knee extension (L3) 3+* 4  Knee flexion 4 3+  Hip abduction unable unable  Hip extension    Hip external rotation    Hip internal rotation    Hip adduction    Ankle dorsiflexion (L4)    Ankle plantarflexion (S1) Partial ROM bil  Partial ROM bil  Ankle inversion    Ankle eversion    Great Toe ext (L5)    Grossly     (Blank rows = not tested, score listed is out of 5 possible points.  N = WNL, D = diminished, C = clear for gross weakness with myotome testing, * = concordant pain with testing)  LE ROM:  ROM Right 04/07/2022 Left 04/07/2022  Hip flexion    Hip extension    Hip abduction    Hip adduction    Hip internal rotation    Hip external rotation    Knee flexion    Knee extension    Ankle dorsiflexion To neutral w/ OP 5 degrees w/ OP  Ankle plantarflexion    Ankle inversion    Ankle eversion     (Blank rows = not tested, N = WNL, * = concordant pain with testing)  Functional Tests  Eval (04/07/2022)    Progressive balance screen (highest level completed for >/= 10''):  Feet together: 10'' Semi Tandem: R in rear 5'', L in rear 10''     30'' STS: 5x  UE used? Ys                                                      PATIENT SURVEYS:  FOTO 21 -> 47   TODAY'S TREATMENT: Creating, reviewing, and completing below HEP   PATIENT EDUCATION:  POC, diagnosis, prognosis, HEP, and outcome measures.  Pt educated via explanation, demonstration, and handout (HEP).  Pt confirms understanding verbally.   HOME EXERCISE PROGRAM: Access Code: [email or web address] URL: [email or web address] Date: 04/07/2022 Prepared by: Shearon Balo  Exercises - Seated Toe Towel Scrunches  - 1 x daily - 7 x weekly - 3 sets - 10 reps - Ankle Inversion Eversion Towel Slide  - 1 x daily - 7 x weekly - 3 sets - 10 reps - Seated Ankle Plantar Flexion with Resistance Loop  - 1 x daily - 7 x weekly - 3 sets - 10 reps - 3 seconds hold  ASTERISK SIGNS   Asterisk Signs Eval (04/07/2022)       R ankle DF neutral       Max pain 10/10       balance Semi tandem R in rear <10''       30'' STS 5x w/ UE support                 ASSESSMENT:  CLINICAL IMPRESSION: Tacia is a 66 y.o. female who presents to clinic with signs and sxs consistent with R posterior ankle pain secondary to insertional achilles tendonitis.  Overall gross weakness in bil LE and hips, poor R ankle DF ROM, and balance deficit.  Pt will benefit from skilled PT to address the deficits.  OBJECTIVE IMPAIRMENTS: Pain, R ankle ROM, general LE and hip weakness, balance  ACTIVITY LIMITATIONS: walking, standing, housework, ADLs like shopping  PERSONAL FACTORS: See medical history and pertinent history   REHAB POTENTIAL: Good  CLINICAL DECISION MAKING: Stable/uncomplicated  EVALUATION COMPLEXITY: Low   GOALS:   SHORT TERM GOALS: Target date: 04/28/2022  Zachary will be >75% HEP compliant to improve carryover between sessions and facilitate independent management of condition  Evaluation (04/07/2022): ongoing Goal status: INITIAL   LONG TERM GOALS: Target date: 06/02/2022  Canda will improve FOTO score to 47 as a proxy for functional improvement  Evaluation/Baseline (04/07/2022): 21 Goal status: INITIAL   2.  Brexley will self report >/= 50% decrease in pain from evaluation   Evaluation/Baseline (04/07/2022): 10/10 max pain Goal status: INITIAL   3.  Zelda will be able to stand for >30'' in semi tandem stance, to show a significant improvement in balance in order to reduce fall risk   Evaluation/Baseline (04/07/2022): 5'' R in rear Goal  status: INITIAL   4.  Haruko will improve right DF to >/= 5 degrees  Evaluation/Baseline (04/07/2022): 0 degrees Goal status: INITIAL     PLAN: PT FREQUENCY: 1-2x/week  PT DURATION: 8 weeks (Ending 06/02/2022)  PLANNED INTERVENTIONS: Therapeutic exercises, Aquatic therapy, Therapeutic activity, Neuro Muscular re-education, Gait training, Patient/Family education, Joint mobilization, Dry Needling, Electrical stimulation, Spinal mobilization and/or manipulation, Moist heat, Taping, Vasopneumatic device, Ionotophoresis '4mg'$ /ml Dexamethasone, and Manual therapy  PLAN FOR NEXT SESSION: progressive loading of achilles, balance   Shearon Balo PT, DPT 04/07/2022, 9:51 AM

## 2022-04-07 ENCOUNTER — Encounter: Payer: Self-pay | Admitting: Physical Therapy

## 2022-04-07 ENCOUNTER — Ambulatory Visit: Payer: Medicare Other | Attending: Podiatry | Admitting: Physical Therapy

## 2022-04-07 ENCOUNTER — Other Ambulatory Visit: Payer: Self-pay

## 2022-04-07 DIAGNOSIS — M7661 Achilles tendinitis, right leg: Secondary | ICD-10-CM | POA: Diagnosis not present

## 2022-04-07 DIAGNOSIS — R600 Localized edema: Secondary | ICD-10-CM | POA: Insufficient documentation

## 2022-04-07 DIAGNOSIS — M6281 Muscle weakness (generalized): Secondary | ICD-10-CM | POA: Diagnosis not present

## 2022-04-07 DIAGNOSIS — M25571 Pain in right ankle and joints of right foot: Secondary | ICD-10-CM | POA: Diagnosis not present

## 2022-04-07 DIAGNOSIS — R2681 Unsteadiness on feet: Secondary | ICD-10-CM | POA: Diagnosis not present

## 2022-04-12 ENCOUNTER — Ambulatory Visit: Payer: Medicare PPO | Attending: Podiatry

## 2022-04-12 DIAGNOSIS — R2681 Unsteadiness on feet: Secondary | ICD-10-CM | POA: Insufficient documentation

## 2022-04-12 DIAGNOSIS — M25571 Pain in right ankle and joints of right foot: Secondary | ICD-10-CM | POA: Diagnosis not present

## 2022-04-12 DIAGNOSIS — R600 Localized edema: Secondary | ICD-10-CM | POA: Insufficient documentation

## 2022-04-12 DIAGNOSIS — H401122 Primary open-angle glaucoma, left eye, moderate stage: Secondary | ICD-10-CM | POA: Diagnosis not present

## 2022-04-12 DIAGNOSIS — M6281 Muscle weakness (generalized): Secondary | ICD-10-CM | POA: Diagnosis not present

## 2022-04-12 NOTE — Therapy (Signed)
OUTPATIENT PHYSICAL THERAPY TREATMENT NOTE   Patient Name: Taylor Ramirez MRN: [id number] DOB:04-Mar-1956, 66 y.o., female Today's Date: 04/12/2022  PCP: Dorothyann Peng, NP REFERRING PROVIDER: Criselda Peaches, DPM  END OF SESSION:   PT End of Session - 04/12/22 1303     Visit Number 2    Date for PT Re-Evaluation 06/02/22    Authorization Type UHC MCR    Progress Note Due on Visit 10    PT Start Time 1304    PT Stop Time 1344    PT Time Calculation (min) 40 min    Activity Tolerance Patient tolerated treatment well    Behavior During Therapy [facility] for tasks assessed/performed             Past Medical History:  Diagnosis Date   Allergic rhinitis    Anemia    h/o   Anxiety    Asthma    Pulmonary function Tests 09-2006: mild asthma   Diabetes mellitus    Heart murmur    ECHO 5/10 was neg   Hypertension    Hypothyroidism    Premature surgical menopause on HRT    after hysterectomy 2011   Psychosis ([place]) 05/16/2014   Schizophrenia ([place])    Vaginal discharge 03/2015   Vaginal infection 03/2015   Past Surgical History:  Procedure Laterality Date   ABDOMINAL HYSTERECTOMY  2011   no oophorectomy   TONSILLECTOMY     Patient Active Problem List   Diagnosis Date Noted   Acanthosis nigricans 02/02/2022   Body mass index (BMI) 50.0-59.9, adult ([place]) 02/02/2022   Chronic pain 02/02/2022   Hyperlipidemia 02/02/2022   Moderate persistent asthma with (acute) exacerbation 02/02/2022   Morbid obesity ([place]) 02/02/2022   Papillomavirus as the cause of diseases classified elsewhere 02/02/2022   Skin ulcer ([place]) 02/02/2022   Tinea corporis 02/02/2022   Carpal tunnel syndrome 12/10/2019   Vaginitis 12/22/2017   Candidal vulvovaginitis 12/22/2017   Asthma 12/22/2017   Altered mental status    PCP NOTES >>>>>> 07/23/2015   Cardiac murmur 11/12/2014   Abnormal LFTs 05/22/2014   Decreased potassium in the blood 05/20/2014   Schizophrenia, unspecified ([place]) 05/16/2014    DOE (dyspnea on exertion) 03/01/2013   Back sprain 01/17/2012   Foot pain 12/02/2011   General medical examination 07/04/2011   DJD (degenerative joint disease) 07/04/2011   Hypothyroidism 11/28/2006   DMII (diabetes mellitus, type 2) ([place]) 11/28/2006   ANEMIA-NOS 11/28/2006   HTN (hypertension) 11/28/2006   Allergic rhinitis 11/28/2006   ASTHMA 11/28/2006    REFERRING DIAG: Tendonitis, Achilles, right [M76.61]  THERAPY DIAG:  Pain in right ankle and joints of right foot  Muscle weakness  Unsteadiness on feet  Localized edema  Rationale for Evaluation and Treatment Rehabilitation  PERTINENT HISTORY: Asthma, DM TII, schizophrenia   SUBJECTIVE: Pt reports she bought walking shoes since her eval. She reports continued Rt>Lt Achilles insertional pain.   Pain:  Are you having pain? No Pain location: R achilles tendon NPRS scale:  current 0/10  average 8/10  Aggravating factors: walking, steps           NPRS, highest: 10/10 Relieving factors: rest           NPRS: best: 0/10 Pain description: intermittent, sharp, and dull Stage: Chronic Stability: staying the same 24 hour pattern: worse with activity    OBJECTIVE: (objective measures completed at initial evaluation unless otherwise dated)   DIAGNOSTIC FINDINGS:  NA  GENERAL OBSERVATION/GAIT:                     Slow antalgic gait, reduced tim in stance R, forward flexed posture in gait   SENSATION:          Light touch: Appears intact   PALPATION: TTP R achilles insertion; mild/moderate edema surrounding achilles insertion   LE MMT:   MMT Right 04/07/2022 Left 04/07/2022  Hip flexion (L2, L3) 3+ 3+  Knee extension (L3) 3+* 4  Knee flexion 4 3+  Hip abduction unable unable  Hip extension      Hip external rotation      Hip internal rotation      Hip adduction      Ankle dorsiflexion (L4)      Ankle plantarflexion (S1) Partial ROM bil  Partial ROM bil  Ankle inversion      Ankle eversion       Great Toe ext (L5)      Grossly        (Blank rows = not tested, score listed is out of 5 possible points.  N = WNL, D = diminished, C = clear for gross weakness with myotome testing, * = concordant pain with testing)   LE ROM:   ROM Right 04/07/2022 Left 04/07/2022  Hip flexion      Hip extension      Hip abduction      Hip adduction      Hip internal rotation      Hip external rotation      Knee flexion      Knee extension      Ankle dorsiflexion To neutral w/ OP 5 degrees w/ OP  Ankle plantarflexion      Ankle inversion      Ankle eversion        (Blank rows = not tested, N = WNL, * = concordant pain with testing)   Functional Tests   Eval (04/07/2022)      Progressive balance screen (highest level completed for >/= 10''):   Feet together: 10'' Semi Tandem: R in rear 5'', L in rear 10''        30'' STS: 5x  UE used? Ys                                                                                              PATIENT SURVEYS:  FOTO 21 -> 47     TODAY'S TREATMENT:  [facility] Adult PT Treatment:                                                DATE: 04/12/2022 Therapeutic Exercise: Heel raises on 2-inch step with UE support 3x10 with slow eccentric phase Long-sitting plantar fascia/ calf stretch with sheet x60mn BIL Seated ankle inversion with 7# cable to forefoot 2x10 BIL Seated ankle eversion with 7# cable to forefoot 2x10 BIL Seated LAQ isometric with ankle dorsiflexion with 7# cable to forefoot 2x10 BIL  10# kettlebell dead lift 3x10 Seated active hamstring stretch x59mn BIL Manual Therapy: N/A Neuromuscular re-ed: N/A Therapeutic Activity: N/A Modalities: N/A Self Care: N/A      PATIENT EDUCATION:  POC, diagnosis, prognosis, HEP, and outcome measures.  Pt educated via explanation, demonstration, and handout (HEP).  Pt confirms understanding verbally.    HOME EXERCISE PROGRAM: Access Code: [email or web address]:  [email or web address] Date: 04/07/2022 Prepared by: KShearon Balo  Exercises - Seated Toe Towel Scrunches  - 1 x daily - 7 x weekly - 3 sets - 10 reps - Ankle Inversion Eversion Towel Slide  - 1 x daily - 7 x weekly - 3 sets - 10 reps - Seated Ankle Plantar Flexion with Resistance Loop  - 1 x daily - 7 x weekly - 3 sets - 10 reps - 3 seconds hold   ASTERISK SIGNS     Asterisk Signs Eval (04/07/2022)            R ankle DF neutral            Max pain 10/10            balance Semi tandem R in rear <10''            30'' STS 5x w/ UE support                               ASSESSMENT:   CLINICAL IMPRESSION: Pt responded well to all initial interventions today, demonstrating good form and no increase in pain with completed exercises. She will continue to benefit from skilled PT to address her primary impairments and return to her prior level of function with less limitation.   OBJECTIVE IMPAIRMENTS: Pain, R ankle ROM, general LE and hip weakness, balance   ACTIVITY LIMITATIONS: walking, standing, housework, ADLs like shopping   PERSONAL FACTORS: See medical history and pertinent history        GOALS:     SHORT TERM GOALS: Target date: 04/28/2022   PStephannywill be >75% HEP compliant to improve carryover between sessions and facilitate independent management of condition   Evaluation (04/07/2022): ongoing Goal status: INITIAL     LONG TERM GOALS: Target date: 06/02/2022   PMulanwill improve FOTO score to 47 as a proxy for functional improvement   Evaluation/Baseline (04/07/2022): 21 Goal status: INITIAL     2.  PDrayawill self report >/= 50% decrease in pain from evaluation    Evaluation/Baseline (04/07/2022): 10/10 max pain Goal status: INITIAL     3.  PYurikawill be able to stand for >30'' in semi tandem stance, to show a significant improvement in balance in order to reduce fall risk    Evaluation/Baseline (04/07/2022): 5'' R in rear Goal  status: INITIAL     4.  POnekawill improve right DF to >/= 5 degrees   Evaluation/Baseline (04/07/2022): 0 degrees Goal status: INITIAL         PLAN: PT FREQUENCY: 1-2x/week   PT DURATION: 8 weeks (Ending 06/02/2022)   PLANNED INTERVENTIONS: Therapeutic exercises, Aquatic therapy, Therapeutic activity, Neuro Muscular re-education, Gait training, Patient/Family education, Joint mobilization, Dry Needling, Electrical stimulation, Spinal mobilization and/or manipulation, Moist heat, Taping, Vasopneumatic device, Ionotophoresis '4mg'$ /ml Dexamethasone, and Manual therapy   PLAN FOR NEXT SESSION: progressive loading of achilles, balance    YVanessa [place] PT, DPT 04/12/22 1:44 PM

## 2022-04-14 ENCOUNTER — Ambulatory Visit: Payer: Medicare PPO

## 2022-04-14 DIAGNOSIS — R2681 Unsteadiness on feet: Secondary | ICD-10-CM | POA: Diagnosis not present

## 2022-04-14 DIAGNOSIS — M6281 Muscle weakness (generalized): Secondary | ICD-10-CM | POA: Diagnosis not present

## 2022-04-14 DIAGNOSIS — M25571 Pain in right ankle and joints of right foot: Secondary | ICD-10-CM

## 2022-04-14 DIAGNOSIS — R600 Localized edema: Secondary | ICD-10-CM | POA: Diagnosis not present

## 2022-04-14 NOTE — Therapy (Signed)
OUTPATIENT PHYSICAL THERAPY TREATMENT NOTE   Patient Name: Taylor Ramirez MRN: [id number] DOB:[birth year], 66 y.o., female Today's Date: 04/14/2022  PCP: Dorothyann Peng, NP REFERRING PROVIDER: Criselda Peaches, DPM  END OF SESSION:   PT End of Session - 04/14/22 1616     Visit Number 3    Date for PT Re-Evaluation 06/02/22    Authorization Type UHC MCR    Progress Note Due on Visit 10    PT Start Time 2355    PT Stop Time 1657    PT Time Calculation (min) 40 min    Activity Tolerance Patient tolerated treatment well    Behavior During Therapy [facility] for tasks assessed/performed              Past Medical History:  Diagnosis Date   Allergic rhinitis    Anemia    h/o   Anxiety    Asthma    Pulmonary function Tests 09-2006: mild asthma   Diabetes mellitus    Heart murmur    ECHO 5/10 was neg   Hypertension    Hypothyroidism    Premature surgical menopause on HRT    after hysterectomy 2011   Psychosis ([place]) 05/16/2014   Schizophrenia ([place] [address] [place])    Vaginal discharge 03/2015   Vaginal infection 03/2015   Past Surgical History:  Procedure Laterality Date   ABDOMINAL HYSTERECTOMY  2011   no oophorectomy   TONSILLECTOMY     Patient Active Problem List   Diagnosis Date Noted   Acanthosis nigricans 02/02/2022   Body mass index (BMI) 50.0-59.9, adult ([place]) 02/02/2022   Chronic pain 02/02/2022   Hyperlipidemia 02/02/2022   Moderate persistent asthma with (acute) exacerbation 02/02/2022   Morbid obesity ([place]) 02/02/2022   Papillomavirus as the cause of diseases classified elsewhere 02/02/2022   Skin ulcer ([place]) 02/02/2022   Tinea corporis 02/02/2022   Carpal tunnel syndrome 12/10/2019   Vaginitis 12/22/2017   Candidal vulvovaginitis 12/22/2017   Asthma 12/22/2017   Altered mental status    PCP NOTES >>>>>> 07/23/2015   Cardiac murmur 11/12/2014   Abnormal LFTs 05/22/2014   Decreased potassium in the blood 05/20/2014   Schizophrenia, unspecified ([place]) 05/16/2014    DOE (dyspnea on exertion) 03/01/2013   Back sprain 01/17/2012   Foot pain 12/02/2011   General medical examination 07/04/2011   DJD (degenerative joint disease) 07/04/2011   Hypothyroidism 11/28/2006   DMII (diabetes mellitus, type 2) ([place]) 11/28/2006   ANEMIA-NOS 11/28/2006   HTN (hypertension) 11/28/2006   Allergic rhinitis 11/28/2006   ASTHMA 11/28/2006    REFERRING DIAG: Tendonitis, Achilles, right [M76.61]  THERAPY DIAG:  Pain in right ankle and joints of right foot  Muscle weakness  Unsteadiness on feet  Localized edema  Rationale for Evaluation and Treatment Rehabilitation  PERTINENT HISTORY: Asthma, DM TII, schizophrenia   SUBJECTIVE: Pt denies any pain currently, adding that she responded well to exercises at last visit. She reports a positive response to icing her ankle after the session and saw significantly decreased swelling. She reports adherence to her HEP.  Pain:  Are you having pain? No Pain location: R achilles tendon NPRS scale:  current 0/10  average 7/10  Aggravating factors: walking, steps           NPRS, highest: 10/10 Relieving factors: rest           NPRS: best: 0/10 Pain description: intermittent, sharp, and dull Stage: Chronic Stability: staying the same 24 hour pattern: worse with activity    OBJECTIVE: (objective  measures completed at initial evaluation unless otherwise dated)   DIAGNOSTIC FINDINGS:  NA             GENERAL OBSERVATION/GAIT:                     Slow antalgic gait, reduced tim in stance R, forward flexed posture in gait   SENSATION:          Light touch: Appears intact   PALPATION: TTP R achilles insertion; mild/moderate edema surrounding achilles insertion   LE MMT:   MMT Right 04/07/2022 Left 04/07/2022  Hip flexion (L2, L3) 3+ 3+  Knee extension (L3) 3+* 4  Knee flexion 4 3+  Hip abduction unable unable  Hip extension      Hip external rotation      Hip internal rotation      Hip adduction       Ankle dorsiflexion (L4)      Ankle plantarflexion (S1) Partial ROM bil  Partial ROM bil  Ankle inversion      Ankle eversion      Great Toe ext (L5)      Grossly        (Blank rows = not tested, score listed is out of 5 possible points.  N = WNL, D = diminished, C = clear for gross weakness with myotome testing, * = concordant pain with testing)   LE ROM:   ROM Right 04/07/2022 Left 04/07/2022  Hip flexion      Hip extension      Hip abduction      Hip adduction      Hip internal rotation      Hip external rotation      Knee flexion      Knee extension      Ankle dorsiflexion To neutral w/ OP 5 degrees w/ OP  Ankle plantarflexion      Ankle inversion      Ankle eversion        (Blank rows = not tested, N = WNL, * = concordant pain with testing)   Functional Tests   Eval (04/07/2022)      Progressive balance screen (highest level completed for >/= 10''):   Feet together: 10'' Semi Tandem: R in rear 5'', L in rear 10''        30'' STS: 5x  UE used? Ys                                                                                              PATIENT SURVEYS:  FOTO 21 -> 47     TODAY'S TREATMENT:  [facility] Adult PT Treatment:                                                DATE: 04/14/2022 Therapeutic Exercise: NuStep level 6 resistance x5 minutes while collecting subjective information. Heel raises on 2-inch step with UE support 3x10 with slow eccentric phase Kickstand stance on Airex pad with  front foot on edge of pad and alternating lateral arm raises 2x20 BIL Dead lift with 15# kettlebell 3x10 Long-sitting plantar fascia/ calf stretch with sheet x46mn BIL Manual Therapy: N/A Neuromuscular re-ed: N/A Therapeutic Activity: N/A Modalities: N/A Self Care: N/A   [facility] Adult PT Treatment:                                                DATE: 04/12/2022 Therapeutic Exercise: Heel raises on 2-inch step with UE support 3x10 with slow eccentric  phase Long-sitting plantar fascia/ calf stretch with sheet x253m BIL Seated ankle inversion with 7# cable to forefoot 2x10 BIL Seated ankle eversion with 7# cable to forefoot 2x10 BIL Seated LAQ isometric with ankle dorsiflexion with 7# cable to forefoot 2x10 BIL 10# kettlebell dead lift 3x10 Seated active hamstring stretch [id number] Manual Therapy: N/A Neuromuscular re-ed: N/A Therapeutic Activity: N/A Modalities: N/A Self Care: N/A      PATIENT EDUCATION:  POC, diagnosis, prognosis, HEP, and outcome measures.  Pt educated via explanation, demonstration, and handout (HEP).  Pt confirms understanding verbally.    HOME EXERCISE PROGRAM: Access Code: [email or web address]: [email or web address] Date: 04/07/2022 Prepared by: KarShearon BaloExercises - Seated Toe Towel Scrunches  - 1 x daily - 7 x weekly - 3 sets - 10 reps - Ankle Inversion Eversion Towel Slide  - 1 x daily - 7 x weekly - 3 sets - 10 reps - Seated Ankle Plantar Flexion with Resistance Loop  - 1 x daily - 7 x weekly - 3 sets - 10 reps - 3 seconds hold   ASTERISK SIGNS     Asterisk Signs Eval (04/07/2022)            R ankle DF neutral            Max pain 10/10            balance Semi tandem R in rear <10''            30'' STS 5x w/ UE support                               ASSESSMENT:   CLINICAL IMPRESSION: Pt responded excellently to all progressed exercises today, demonstrating good form and no increase in pain with exercises. She will continue to benefit from skilled PT to address her primary impairments and return to her prior level of function with less limitation.   OBJECTIVE IMPAIRMENTS: Pain, R ankle ROM, general LE and hip weakness, balance   ACTIVITY LIMITATIONS: walking, standing, housework, ADLs like shopping   PERSONAL FACTORS: See medical history and pertinent history        GOALS:     SHORT TERM GOALS: Target date: 04/28/2022   PatAhonestyll be >75% HEP compliant to  improve carryover between sessions and facilitate independent management of condition   Evaluation (04/07/2022): ongoing Goal status: INITIAL     LONG TERM GOALS: Target date: 06/02/2022   PatKellseyll improve FOTO score to 47 as a proxy for functional improvement   Evaluation/Baseline (04/07/2022): 21 Goal status: INITIAL     2.  PatLeighlall self report >/= 50% decrease in pain from evaluation    Evaluation/Baseline (04/07/2022): 10/10 max pain Goal status: INITIAL     3.  PatKathleanll be  able to stand for >30'' in semi tandem stance, to show a significant improvement in balance in order to reduce fall risk    Evaluation/Baseline (04/07/2022): 5'' R in rear Goal status: INITIAL     4.  Miyu will improve right DF to >/= 5 degrees   Evaluation/Baseline (04/07/2022): 0 degrees Goal status: INITIAL         PLAN: PT FREQUENCY: 1-2x/week   PT DURATION: 8 weeks (Ending 06/02/2022)   PLANNED INTERVENTIONS: Therapeutic exercises, Aquatic therapy, Therapeutic activity, Neuro Muscular re-education, Gait training, Patient/Family education, Joint mobilization, Dry Needling, Electrical stimulation, Spinal mobilization and/or manipulation, Moist heat, Taping, Vasopneumatic device, Ionotophoresis '4mg'$ /ml Dexamethasone, and Manual therapy   PLAN FOR NEXT SESSION: progressive loading of achilles, balance    Vanessa [place], PT, DPT 04/14/22 4:57 PM

## 2022-04-20 ENCOUNTER — Telehealth: Payer: Self-pay

## 2022-04-20 ENCOUNTER — Ambulatory Visit: Payer: Medicare PPO

## 2022-04-20 NOTE — Telephone Encounter (Signed)
Spoke with pt regarding her first no show. Discussed clinic attendance policy and confirmed pt's next appointment.

## 2022-04-22 ENCOUNTER — Ambulatory Visit: Payer: Medicare PPO | Admitting: Physical Therapy

## 2022-04-26 ENCOUNTER — Ambulatory Visit: Payer: Medicare PPO

## 2022-04-26 DIAGNOSIS — R2681 Unsteadiness on feet: Secondary | ICD-10-CM | POA: Diagnosis not present

## 2022-04-26 DIAGNOSIS — R600 Localized edema: Secondary | ICD-10-CM

## 2022-04-26 DIAGNOSIS — M25571 Pain in right ankle and joints of right foot: Secondary | ICD-10-CM | POA: Diagnosis not present

## 2022-04-26 DIAGNOSIS — M6281 Muscle weakness (generalized): Secondary | ICD-10-CM

## 2022-04-26 NOTE — Therapy (Signed)
OUTPATIENT PHYSICAL THERAPY TREATMENT NOTE   Patient Name: Taylor Ramirez MRN: [id number] DOB:July 10, 1956, 66 y.o., female Today's Date: 04/26/2022  PCP: Dorothyann Peng, NP REFERRING PROVIDER: Criselda Peaches, DPM  END OF SESSION:   PT End of Session - 04/26/22 1220     Visit Number 4    Date for PT Re-Evaluation 06/02/22    Authorization Type UHC MCR    Authorization Time Period FOTO v6, v10, kx mod v15    Progress Note Due on Visit 10    PT Start Time 1217    PT Stop Time 1257    PT Time Calculation (min) 40 min    Activity Tolerance Patient tolerated treatment well    Behavior During Therapy WFL for tasks assessed/performed               Past Medical History:  Diagnosis Date   Allergic rhinitis    Anemia    h/o   Anxiety    Asthma    Pulmonary function Tests 09-2006: mild asthma   Diabetes mellitus    Heart murmur    ECHO 5/10 was neg   Hypertension    Hypothyroidism    Premature surgical menopause on HRT    after hysterectomy 2011   Psychosis ([place]) 05/16/2014   Schizophrenia ([place])    Vaginal discharge 03/2015   Vaginal infection 03/2015   Past Surgical History:  Procedure Laterality Date   ABDOMINAL HYSTERECTOMY  2011   no oophorectomy   TONSILLECTOMY     Patient Active Problem List   Diagnosis Date Noted   Acanthosis nigricans 02/02/2022   Body mass index (BMI) 50.0-59.9, adult ([place]) 02/02/2022   Chronic pain 02/02/2022   Hyperlipidemia 02/02/2022   Moderate persistent asthma with (acute) exacerbation 02/02/2022   Morbid obesity ([place]) 02/02/2022   Papillomavirus as the cause of diseases classified elsewhere 02/02/2022   Skin ulcer ([place]) 02/02/2022   Tinea corporis 02/02/2022   Carpal tunnel syndrome 12/10/2019   Vaginitis 12/22/2017   Candidal vulvovaginitis 12/22/2017   Asthma 12/22/2017   Altered mental status    PCP NOTES >>>>>> 07/23/2015   Cardiac murmur 11/12/2014   Abnormal LFTs 05/22/2014   Decreased potassium in the blood  05/20/2014   Schizophrenia, unspecified ([place]) 05/16/2014   DOE (dyspnea on exertion) 03/01/2013   Back sprain 01/17/2012   Foot pain 12/02/2011   General medical examination 07/04/2011   DJD (degenerative joint disease) 07/04/2011   Hypothyroidism 11/28/2006   DMII (diabetes mellitus, type 2) ([place]) 11/28/2006   ANEMIA-NOS 11/28/2006   HTN (hypertension) 11/28/2006   Allergic rhinitis 11/28/2006   ASTHMA 11/28/2006    REFERRING DIAG: Tendonitis, Achilles, right [M76.61]  THERAPY DIAG:  Pain in right ankle and joints of right foot  Muscle weakness  Unsteadiness on feet  Localized edema  Rationale for Evaluation and Treatment Rehabilitation  PERTINENT HISTORY: Asthma, DM TII, schizophrenia   SUBJECTIVE: Pt reports increased Rt heel pain today, which she attributes to the weather change.   Pain:  Are you having pain? No Pain location: R achilles tendon NPRS scale:  current 7/10  average 7/10  Aggravating factors: walking, steps           NPRS, highest: 10/10 Relieving factors: rest           NPRS: best: 0/10 Pain description: intermittent, sharp, and dull Stage: Chronic Stability: staying the same 24 hour pattern: worse with activity    OBJECTIVE: (objective measures completed at initial evaluation unless otherwise dated)  DIAGNOSTIC FINDINGS:  NA             GENERAL OBSERVATION/GAIT:                     Slow antalgic gait, reduced tim in stance R, forward flexed posture in gait   SENSATION:          Light touch: Appears intact   PALPATION: TTP R achilles insertion; mild/moderate edema surrounding achilles insertion   LE MMT:   MMT Right 04/07/2022 Left 04/07/2022  Hip flexion (L2, L3) 3+ 3+  Knee extension (L3) 3+* 4  Knee flexion 4 3+  Hip abduction unable unable  Hip extension      Hip external rotation      Hip internal rotation      Hip adduction      Ankle dorsiflexion (L4)      Ankle plantarflexion (S1) Partial ROM bil  Partial ROM bil   Ankle inversion      Ankle eversion      Great Toe ext (L5)      Grossly        (Blank rows = not tested, score listed is out of 5 possible points.  N = WNL, D = diminished, C = clear for gross weakness with myotome testing, * = concordant pain with testing)   LE ROM:   ROM Right 04/07/2022 Left 04/07/2022  Hip flexion      Hip extension      Hip abduction      Hip adduction      Hip internal rotation      Hip external rotation      Knee flexion      Knee extension      Ankle dorsiflexion To neutral w/ OP 5 degrees w/ OP  Ankle plantarflexion      Ankle inversion      Ankle eversion        (Blank rows = not tested, N = WNL, * = concordant pain with testing)   Functional Tests   Eval (04/07/2022)      Progressive balance screen (highest level completed for >/= 10''):   Feet together: 10'' Semi Tandem: R in rear 5'', L in rear 10''        30'' STS: 5x  UE used? Ys                                                                                              PATIENT SURVEYS:  FOTO 21 -> 47     TODAY'S TREATMENT:  [facility] Adult PT Treatment:                                                DATE: 04/26/2022 Therapeutic Exercise: Long-sitting plantar fascia/ calf stretch with sheet x53mn BIL Heel raises on 2-inch step with UE support 2x10 with slow eccentric phase Shuttle press heel raises 3x20 with 20# Seated ankle inversion with 7# cable to forefoot 2x10 BIL  Seated ankle eversion with 7# cable to forefoot 2x10 BIL Standing slant board soleus stretch x86mn Dead lift with 15# kettlebell 3x10  Manual Therapy: N/A Neuromuscular re-ed: N/A Therapeutic Activity: N/A Modalities: N/A Self Care: N/A   [facility] Adult PT Treatment:                                                DATE: 04/14/2022 Therapeutic Exercise: NuStep level 6 resistance x5 minutes while collecting subjective information. Heel raises on 2-inch step with UE support 3x10 with slow eccentric  phase Kickstand stance on Airex pad with front foot on edge of pad and alternating lateral arm raises 2x20 BIL Dead lift with 15# kettlebell 3x10 Long-sitting plantar fascia/ calf stretch with sheet x21m BIL Manual Therapy: N/A Neuromuscular re-ed: N/A Therapeutic Activity: N/A Modalities: N/A Self Care: N/A   [facility] Adult PT Treatment:                                                DATE: 04/12/2022 Therapeutic Exercise: Heel raises on 2-inch step with UE support 3x10 with slow eccentric phase Long-sitting plantar fascia/ calf stretch with sheet x2m98mBIL Seated ankle inversion with 7# cable to forefoot 2x10 BIL Seated ankle eversion with 7# cable to forefoot 2x10 BIL Seated LAQ isometric with ankle dorsiflexion with 7# cable to forefoot 2x10 BIL 10# kettlebell dead lift 3x10 Seated active hamstring stretch [id number] Manual Therapy: N/A Neuromuscular re-ed: N/A Therapeutic Activity: N/A Modalities: N/A Self Care: N/A      PATIENT EDUCATION:  POC, diagnosis, prognosis, HEP, and outcome measures.  Pt educated via explanation, demonstration, and handout (HEP).  Pt confirms understanding verbally.    HOME EXERCISE PROGRAM: Access Code: [email or web address]: [email or web address] Date: 04/07/2022 Prepared by: KarlShearon Baloxercises - Seated Toe Towel Scrunches  - 1 x daily - 7 x weekly - 3 sets - 10 reps - Ankle Inversion Eversion Towel Slide  - 1 x daily - 7 x weekly - 3 sets - 10 reps - Seated Ankle Plantar Flexion with Resistance Loop  - 1 x daily - 7 x weekly - 3 sets - 10 reps - 3 seconds hold   ASTERISK SIGNS     Asterisk Signs Eval (04/07/2022)            R ankle DF neutral            Max pain 10/10            balance Semi tandem R in rear <10''            30'' STS 5x w/ UE support                               ASSESSMENT:   CLINICAL IMPRESSION: Pt responded well to progressed exercises today, tolerating all loaded ankle strengthening  exercises well. She will continue to benefit from skilled PT to address her primary impairments and return to her prior level of function with less limitation.   OBJECTIVE IMPAIRMENTS: Pain, R ankle ROM, general LE and hip weakness, balance   ACTIVITY LIMITATIONS: walking, standing, housework, ADLs like shopping   PERSONAL FACTORS: See medical  history and pertinent history        GOALS:     SHORT TERM GOALS: Target date: 04/28/2022   Kamyia will be >75% HEP compliant to improve carryover between sessions and facilitate independent management of condition   Evaluation (04/07/2022): ongoing Goal status: INITIAL     LONG TERM GOALS: Target date: 06/02/2022   Shawna will improve FOTO score to 47 as a proxy for functional improvement   Evaluation/Baseline (04/07/2022): 21 Goal status: INITIAL     2.  Yashvi will self report >/= 50% decrease in pain from evaluation    Evaluation/Baseline (04/07/2022): 10/10 max pain Goal status: INITIAL     3.  Bettyann will be able to stand for >30'' in semi tandem stance, to show a significant improvement in balance in order to reduce fall risk    Evaluation/Baseline (04/07/2022): 5'' R in rear Goal status: INITIAL     4.  Maddalynn will improve right DF to >/= 5 degrees   Evaluation/Baseline (04/07/2022): 0 degrees Goal status: INITIAL         PLAN: PT FREQUENCY: 1-2x/week   PT DURATION: 8 weeks (Ending 06/02/2022)   PLANNED INTERVENTIONS: Therapeutic exercises, Aquatic therapy, Therapeutic activity, Neuro Muscular re-education, Gait training, Patient/Family education, Joint mobilization, Dry Needling, Electrical stimulation, Spinal mobilization and/or manipulation, Moist heat, Taping, Vasopneumatic device, Ionotophoresis '4mg'$ /ml Dexamethasone, and Manual therapy   PLAN FOR NEXT SESSION: progressive loading of achilles, balance    Vanessa [place], PT, DPT 04/26/22 12:58 PM

## 2022-04-28 ENCOUNTER — Ambulatory Visit: Payer: Medicare PPO

## 2022-04-28 DIAGNOSIS — M25571 Pain in right ankle and joints of right foot: Secondary | ICD-10-CM

## 2022-04-28 DIAGNOSIS — R600 Localized edema: Secondary | ICD-10-CM

## 2022-04-28 DIAGNOSIS — R2681 Unsteadiness on feet: Secondary | ICD-10-CM

## 2022-04-28 DIAGNOSIS — M6281 Muscle weakness (generalized): Secondary | ICD-10-CM

## 2022-04-28 NOTE — Therapy (Signed)
OUTPATIENT PHYSICAL THERAPY TREATMENT NOTE   Patient Name: Taylor Ramirez MRN: [id number] DOB:January 08, 1956, 66 y.o., female Today's Date: 04/28/2022  PCP: Dorothyann Peng, NP REFERRING PROVIDER: Criselda Peaches, DPM  END OF SESSION:   PT End of Session - 04/28/22 1207     Visit Number 5    Date for PT Re-Evaluation 06/02/22    Authorization Type UHC MCR    Authorization Time Period FOTO v6, v10, kx mod v15    Progress Note Due on Visit 10    PT Start Time 1210    PT Stop Time 1250    PT Time Calculation (min) 40 min    Activity Tolerance Patient tolerated treatment well    Behavior During Therapy WFL for tasks assessed/performed              Past Medical History:  Diagnosis Date   Allergic rhinitis    Anemia    h/o   Anxiety    Asthma    Pulmonary function Tests 09-2006: mild asthma   Diabetes mellitus    Heart murmur    ECHO 5/10 was neg   Hypertension    Hypothyroidism    Premature surgical menopause on HRT    after hysterectomy 2011   Psychosis ([place]) 05/16/2014   Schizophrenia ([place])    Vaginal discharge 03/2015   Vaginal infection 03/2015   Past Surgical History:  Procedure Laterality Date   ABDOMINAL HYSTERECTOMY  2011   no oophorectomy   TONSILLECTOMY     Patient Active Problem List   Diagnosis Date Noted   Acanthosis nigricans 02/02/2022   Body mass index (BMI) 50.0-59.9, adult ([place]) 02/02/2022   Chronic pain 02/02/2022   Hyperlipidemia 02/02/2022   Moderate persistent asthma with (acute) exacerbation 02/02/2022   Morbid obesity ([place]) 02/02/2022   Papillomavirus as the cause of diseases classified elsewhere 02/02/2022   Skin ulcer ([place]) 02/02/2022   Tinea corporis 02/02/2022   Carpal tunnel syndrome 12/10/2019   Vaginitis 12/22/2017   Candidal vulvovaginitis 12/22/2017   Asthma 12/22/2017   Altered mental status    PCP NOTES >>>>>> 07/23/2015   Cardiac murmur 11/12/2014   Abnormal LFTs 05/22/2014   Decreased potassium in the blood  05/20/2014   Schizophrenia, unspecified ([place]) 05/16/2014   DOE (dyspnea on exertion) 03/01/2013   Back sprain 01/17/2012   Foot pain 12/02/2011   General medical examination 07/04/2011   DJD (degenerative joint disease) 07/04/2011   Hypothyroidism 11/28/2006   DMII (diabetes mellitus, type 2) ([place]) 11/28/2006   ANEMIA-NOS 11/28/2006   HTN (hypertension) 11/28/2006   Allergic rhinitis 11/28/2006   ASTHMA 11/28/2006    REFERRING DIAG: Tendonitis, Achilles, right [M76.61]  THERAPY DIAG:  Pain in right ankle and joints of right foot  Muscle weakness  Unsteadiness on feet  Localized edema  Rationale for Evaluation and Treatment Rehabilitation  PERTINENT HISTORY: Asthma, DM TII, schizophrenia   SUBJECTIVE: Patient reports BIL ankle pain today, R>L and also in the Lt knee.   Pain:  Are you having pain? No Pain location: R achilles tendon NPRS scale:  current 6/10  average 7/10  Aggravating factors: walking, steps           NPRS, highest: 10/10 Relieving factors: rest           NPRS: best: 0/10 Pain description: intermittent, sharp, and dull Stage: Chronic Stability: staying the same 24 hour pattern: worse with activity    OBJECTIVE: (objective measures completed at initial evaluation unless otherwise dated)   DIAGNOSTIC FINDINGS:  NA             GENERAL OBSERVATION/GAIT:                     Slow antalgic gait, reduced tim in stance R, forward flexed posture in gait   SENSATION:          Light touch: Appears intact   PALPATION: TTP R achilles insertion; mild/moderate edema surrounding achilles insertion   LE MMT:   MMT Right 04/07/2022 Left 04/07/2022  Hip flexion (L2, L3) 3+ 3+  Knee extension (L3) 3+* 4  Knee flexion 4 3+  Hip abduction unable unable  Hip extension      Hip external rotation      Hip internal rotation      Hip adduction      Ankle dorsiflexion (L4)      Ankle plantarflexion (S1) Partial ROM bil  Partial ROM bil  Ankle inversion       Ankle eversion      Great Toe ext (L5)      Grossly        (Blank rows = not tested, score listed is out of 5 possible points.  N = WNL, D = diminished, C = clear for gross weakness with myotome testing, * = concordant pain with testing)   LE ROM:   ROM Right 04/07/2022 Left 04/07/2022  Hip flexion      Hip extension      Hip abduction      Hip adduction      Hip internal rotation      Hip external rotation      Knee flexion      Knee extension      Ankle dorsiflexion To neutral w/ OP 5 degrees w/ OP  Ankle plantarflexion      Ankle inversion      Ankle eversion        (Blank rows = not tested, N = WNL, * = concordant pain with testing)   Functional Tests   Eval (04/07/2022)      Progressive balance screen (highest level completed for >/= 10''):   Feet together: 10'' Semi Tandem: R in rear 5'', L in rear 10''        30'' STS: 5x  UE used? Ys                                                                                              PATIENT SURVEYS:  FOTO 21 -> 47     TODAY'S TREATMENT: [facility] Adult PT Treatment:                                                DATE: 04/28/2022 Therapeutic Exercise: Nustep level 4 x 6 mins Slant board gastroc stretch 2x1' Heel raises on 2-inch step with UE support x5 with slow eccentric phase (pt sliding out of sandals) Seated heel raises 3x10 Seated toe raises 3x10 LAQ 3# 3x10 BIL Seated hamstring  curl GTB 3x10 BIL Standing hip abduction with UE support 2x10 Neuromuscular Re-ed: Feet together x30" EC Semi tandem x30" BIL   [facility] Adult PT Treatment:                                                DATE: 04/26/2022 Therapeutic Exercise: Long-sitting plantar fascia/ calf stretch with sheet x48mn BIL Heel raises on 2-inch step with UE support 2x10 with slow eccentric phase Shuttle press heel raises 3x20 with 20# Seated ankle inversion with 7# cable to forefoot 2x10 BIL Seated ankle eversion with 7# cable to forefoot  2x10 BIL Standing slant board soleus stretch x241m Dead lift with 15# kettlebell 3x10 Manual Therapy: N/A Neuromuscular re-ed: N/A Therapeutic Activity: N/A Modalities: N/A Self Care: N/A   [facility] Adult PT Treatment:                                                DATE: 04/14/2022 Therapeutic Exercise: NuStep level 6 resistance x5 minutes while collecting subjective information. Heel raises on 2-inch step with UE support 3x10 with slow eccentric phase Kickstand stance on Airex pad with front foot on edge of pad and alternating lateral arm raises 2x20 BIL Dead lift with 15# kettlebell 3x10 Long-sitting plantar fascia/ calf stretch with sheet x2m52mBIL Manual Therapy: N/A Neuromuscular re-ed: N/A Therapeutic Activity: N/A Modalities: N/A Self Care: N/A      PATIENT EDUCATION:  POC, diagnosis, prognosis, HEP, and outcome measures.  Pt educated via explanation, demonstration, and handout (HEP).  Pt confirms understanding verbally.    HOME EXERCISE PROGRAM: Access Code: [email or web address]: [email or web address] Date: 04/07/2022 Prepared by: KarShearon BaloExercises - Seated Toe Towel Scrunches  - 1 x daily - 7 x weekly - 3 sets - 10 reps - Ankle Inversion Eversion Towel Slide  - 1 x daily - 7 x weekly - 3 sets - 10 reps - Seated Ankle Plantar Flexion with Resistance Loop  - 1 x daily - 7 x weekly - 3 sets - 10 reps - 3 seconds hold   ASTERISK SIGNS     Asterisk Signs Eval (04/07/2022)            R ankle DF neutral            Max pain 10/10            balance Semi tandem R in rear <10''            30'' STS 5x w/ UE support                               ASSESSMENT:   CLINICAL IMPRESSION: Patient presents to PT with BIL ankle pain, R>L and reports HEP compliance. Session today focused on BIL LE strengthening, improving standing activity tolerance, as well as balance training. During standing heel raises, patient was sliding out of sandals and having  difficulty performing exercise, she noted she was having a hot flash and requested to sit down. She reports symptoms alleviated with sitting rest break. Patient continues to benefit from skilled PT services and should be progressed as able to improve functional independence.     OBJECTIVE IMPAIRMENTS:  Pain, R ankle ROM, general LE and hip weakness, balance   ACTIVITY LIMITATIONS: walking, standing, housework, ADLs like shopping   PERSONAL FACTORS: See medical history and pertinent history        GOALS:     SHORT TERM GOALS: Target date: 04/28/2022   Talyn will be >75% HEP compliant to improve carryover between sessions and facilitate independent management of condition   Evaluation (04/07/2022): ongoing Goal status: INITIAL     LONG TERM GOALS: Target date: 06/02/2022   Roanne will improve FOTO score to 47 as a proxy for functional improvement   Evaluation/Baseline (04/07/2022): 21 Goal status: INITIAL     2.  Vernadette will self report >/= 50% decrease in pain from evaluation    Evaluation/Baseline (04/07/2022): 10/10 max pain Goal status: INITIAL     3.  Marshea will be able to stand for >30'' in semi tandem stance, to show a significant improvement in balance in order to reduce fall risk    Evaluation/Baseline (04/07/2022): 5'' R in rear Goal status: INITIAL     4.  Gali will improve right DF to >/= 5 degrees   Evaluation/Baseline (04/07/2022): 0 degrees Goal status: INITIAL         PLAN: PT FREQUENCY: 1-2x/week   PT DURATION: 8 weeks (Ending 06/02/2022)   PLANNED INTERVENTIONS: Therapeutic exercises, Aquatic therapy, Therapeutic activity, Neuro Muscular re-education, Gait training, Patient/Family education, Joint mobilization, Dry Needling, Electrical stimulation, Spinal mobilization and/or manipulation, Moist heat, Taping, Vasopneumatic device, Ionotophoresis '4mg'$ /ml Dexamethasone, and Manual therapy   PLAN FOR NEXT SESSION: progressive loading of  achilles, balance    Margarette Canada, PTA 04/28/22 12:49 PM

## 2022-05-02 DIAGNOSIS — H401112 Primary open-angle glaucoma, right eye, moderate stage: Secondary | ICD-10-CM | POA: Diagnosis not present

## 2022-05-03 ENCOUNTER — Ambulatory Visit: Payer: Medicare PPO

## 2022-05-03 DIAGNOSIS — R600 Localized edema: Secondary | ICD-10-CM | POA: Diagnosis not present

## 2022-05-03 DIAGNOSIS — R2681 Unsteadiness on feet: Secondary | ICD-10-CM | POA: Diagnosis not present

## 2022-05-03 DIAGNOSIS — M25571 Pain in right ankle and joints of right foot: Secondary | ICD-10-CM | POA: Diagnosis not present

## 2022-05-03 DIAGNOSIS — M6281 Muscle weakness (generalized): Secondary | ICD-10-CM | POA: Diagnosis not present

## 2022-05-03 NOTE — Therapy (Signed)
OUTPATIENT PHYSICAL THERAPY TREATMENT NOTE   Patient Name: Taylor Ramirez MRN: [id number] DOB:[birth year], 66 y.o., female Today's Date: 05/03/2022  PCP: Dorothyann Peng, NP REFERRING PROVIDER: Criselda Peaches, DPM  END OF SESSION:   PT End of Session - 05/03/22 1215     Visit Number 6    Date for PT Re-Evaluation 06/02/22    Authorization Type UHC MCR    Authorization Time Period FOTO v6, v10, kx mod v15    Progress Note Due on Visit 10    PT Start Time 1215    PT Stop Time 1256    PT Time Calculation (min) 41 min    Activity Tolerance Patient tolerated treatment well    Behavior During Therapy WFL for tasks assessed/performed               Past Medical History:  Diagnosis Date   Allergic rhinitis    Anemia    h/o   Anxiety    Asthma    Pulmonary function Tests 09-2006: mild asthma   Diabetes mellitus    Heart murmur    ECHO 5/10 was neg   Hypertension    Hypothyroidism    Premature surgical menopause on HRT    after hysterectomy 2011   Psychosis ([place]) 05/16/2014   Schizophrenia ([place])    Vaginal discharge 03/2015   Vaginal infection 03/2015   Past Surgical History:  Procedure Laterality Date   ABDOMINAL HYSTERECTOMY  2011   no oophorectomy   TONSILLECTOMY     Patient Active Problem List   Diagnosis Date Noted   Acanthosis nigricans 02/02/2022   Body mass index (BMI) 50.0-59.9, adult ([place]) 02/02/2022   Chronic pain 02/02/2022   Hyperlipidemia 02/02/2022   Moderate persistent asthma with (acute) exacerbation 02/02/2022   Morbid obesity ([place]) 02/02/2022   Papillomavirus as the cause of diseases classified elsewhere 02/02/2022   Skin ulcer ([place]) 02/02/2022   Tinea corporis 02/02/2022   Carpal tunnel syndrome 12/10/2019   Vaginitis 12/22/2017   Candidal vulvovaginitis 12/22/2017   Asthma 12/22/2017   Altered mental status    PCP NOTES >>>>>> 07/23/2015   Cardiac murmur 11/12/2014   Abnormal LFTs 05/22/2014   Decreased potassium in the blood  05/20/2014   Schizophrenia, unspecified ([place]) 05/16/2014   DOE (dyspnea on exertion) 03/01/2013   Back sprain 01/17/2012   Foot pain 12/02/2011   General medical examination 07/04/2011   DJD (degenerative joint disease) 07/04/2011   Hypothyroidism 11/28/2006   DMII (diabetes mellitus, type 2) ([place]) 11/28/2006   ANEMIA-NOS 11/28/2006   HTN (hypertension) 11/28/2006   Allergic rhinitis 11/28/2006   ASTHMA 11/28/2006    REFERRING DIAG: Tendonitis, Achilles, right [M76.61]  THERAPY DIAG:  Pain in right ankle and joints of right foot  Muscle weakness  Unsteadiness on feet  Localized edema  Rationale for Evaluation and Treatment Rehabilitation  PERTINENT HISTORY: Asthma, DM TII, schizophrenia   SUBJECTIVE: Pt reports her pain is getting "a lot better." She reports the swelling and pain is much improved.  Pain:  Are you having pain? No Pain location: R achilles tendon NPRS scale:  current 4/10  average 6/10  Aggravating factors: walking, steps           NPRS, highest: 10/10 Relieving factors: rest           NPRS: best: 0/10 Pain description: intermittent, sharp, and dull Stage: Chronic Stability: staying the same 24 hour pattern: worse with activity    OBJECTIVE: (objective measures completed at initial evaluation unless otherwise  dated)   DIAGNOSTIC FINDINGS:  NA             GENERAL OBSERVATION/GAIT:                     Slow antalgic gait, reduced tim in stance R, forward flexed posture in gait   SENSATION:          Light touch: Appears intact   PALPATION: TTP R achilles insertion; mild/moderate edema surrounding achilles insertion   LE MMT:   MMT Right 04/07/2022 Left 04/07/2022 Right 05/03/2022 Left 05/03/2022  Hip flexion (L2, L3) 3+ 3+ 4 4  Knee extension (L3) 3+* 4 4+ 5  Knee flexion 4 3+ 4+ 4  Hip abduction unable unable 4 4-  Hip extension        Hip external rotation        Hip internal rotation        Hip adduction        Ankle dorsiflexion  (L4)        Ankle plantarflexion (S1) Partial ROM bil  Partial ROM bil    Ankle inversion        Ankle eversion        Great Toe ext (L5)        Grossly          (Blank rows = not tested, score listed is out of 5 possible points.  N = WNL, D = diminished, C = clear for gross weakness with myotome testing, * = concordant pain with testing)   LE ROM:   ROM Right 04/07/2022 Left 04/07/2022 A/PROM Right 05/03/2022 A/PROM Left 05/03/2022  Hip flexion        Hip extension        Hip abduction        Hip adduction        Hip internal rotation        Hip external rotation        Knee flexion        Knee extension        Ankle dorsiflexion To neutral w/ OP 5 degrees w/ OP 3/6 5/8  Ankle plantarflexion        Ankle inversion        Ankle eversion          (Blank rows = not tested, N = WNL, * = concordant pain with testing)   Functional Tests   Eval (04/07/2022)  05/03/2022    Progressive balance screen (highest level completed for >/= 10''):   Feet together: 10'' Semi Tandem: R in rear 5'', L in rear 10''   Feet together: 30 seconds (PT terminated)  Semi Tandem: 30 seconds BIL (PT terminated)     30'' STS: 5x  UE used? Ys 6x, No UE support                                                                                             PATIENT SURVEYS:  FOTO 21 -> 47  05/03/2022: 54%     TODAY'S TREATMENT:  [facility] Adult PT Treatment:  DATE: 05/03/2022 Therapeutic Exercise: Seated ankle inversion with 7# cable to forefoot 2x10 BIL Seated ankle eversion with 7# cable to forefoot 2x10 BIL Dead lift with two 8# dumbbells 2x10 Kickstand stance with front foot on edge of Airex pad with alternating lateral arm raises 2x20 BIL Manual Therapy: N/A Neuromuscular re-ed: N/A Therapeutic Activity: Re-assessment of FOTO with pt education Re-assessment of objective measures with pt education Modalities: N/A Self  Care: N/A   [facility] Adult PT Treatment:                                                DATE: 04/28/2022 Therapeutic Exercise: Nustep level 4 x 6 mins Slant board gastroc stretch 2x1' Heel raises on 2-inch step with UE support x5 with slow eccentric phase (pt sliding out of sandals) Seated heel raises 3x10 Seated toe raises 3x10 LAQ 3# 3x10 BIL Seated hamstring curl GTB 3x10 BIL Standing hip abduction with UE support 2x10 Neuromuscular Re-ed: Feet together x30" EC Semi tandem x30" BIL   [facility] Adult PT Treatment:                                                DATE: 04/26/2022 Therapeutic Exercise: Long-sitting plantar fascia/ calf stretch with sheet x25min BIL Heel raises on 2-inch step with UE support 2x10 with slow eccentric phase Shuttle press heel raises 3x20 with 20# Seated ankle inversion with 7# cable to forefoot 2x10 BIL Seated ankle eversion with 7# cable to forefoot 2x10 BIL Standing slant board soleus stretch x35min Dead lift with 15# kettlebell 3x10 Manual Therapy: N/A Neuromuscular re-ed: N/A Therapeutic Activity: N/A Modalities: N/A Self Care: N/A      PATIENT EDUCATION:  POC, diagnosis, prognosis, HEP, and outcome measures.  Pt educated via explanation, demonstration, and handout (HEP).  Pt confirms understanding verbally.    HOME EXERCISE PROGRAM: Access Code: [email or web address] URL: [email or web address] Date: 04/07/2022 Prepared by: Shearon Balo   Exercises - Seated Toe Towel Scrunches  - 1 x daily - 7 x weekly - 3 sets - 10 reps - Ankle Inversion Eversion Towel Slide  - 1 x daily - 7 x weekly - 3 sets - 10 reps - Seated Ankle Plantar Flexion with Resistance Loop  - 1 x daily - 7 x weekly - 3 sets - 10 reps - 3 seconds hold   ASTERISK SIGNS     Asterisk Signs Eval (04/07/2022)            R ankle DF neutral            Max pain 10/10            balance Semi tandem R in rear <10''            30'' STS 5x w/ UE support                                ASSESSMENT:   CLINICAL IMPRESSION: Pt is progressing well with PT; upon assessment of goals, she has met her FOTO goal and 30" STS goal, while making good progress in pain and AROM goals. She responded well to progressed exercises and will continue to benefit from skilled PT  to address her primary impairments and return to her prior level of function with less limitation.    OBJECTIVE IMPAIRMENTS: Pain, R ankle ROM, general LE and hip weakness, balance   ACTIVITY LIMITATIONS: walking, standing, housework, ADLs like shopping   PERSONAL FACTORS: See medical history and pertinent history        GOALS:     SHORT TERM GOALS: Target date: 04/28/2022   Ande will be >75% HEP compliant to improve carryover between sessions and facilitate independent management of condition   Evaluation (04/07/2022): ongoing 05/03/2022: Pt reports adherence to HEP Goal status: ACHIEVED     LONG TERM GOALS: Target date: 06/02/2022   Azlin will improve FOTO score to 47 as a proxy for functional improvement   Evaluation/Baseline (04/07/2022): 21 05/03/2022: 54% Goal status: ACHIEVED     2.  Conna will self report >/= 50% decrease in pain from evaluation    Evaluation/Baseline (04/07/2022): 10/10 max pain 05/03/2022: 6/10 max pain Goal status: IN PROGRESS     3.  Denasia will be able to stand for >30'' in semi tandem stance, to show a significant improvement in balance in order to reduce fall risk    Evaluation/Baseline (04/07/2022): 5'' R in rear 05/03/2022: 30" BIL Goal status: ACHIEVED     4.  Jeena will improve right DF to >/= 5 degrees   Evaluation/Baseline (04/07/2022): 0 degrees 05/03/2022: 3 degrees Goal status: IN PROGRESS         PLAN: PT FREQUENCY: 1-2x/week   PT DURATION: 8 weeks (Ending 06/02/2022)   PLANNED INTERVENTIONS: Therapeutic exercises, Aquatic therapy, Therapeutic activity, Neuro Muscular re-education, Gait training, Patient/Family education, Joint  mobilization, Dry Needling, Electrical stimulation, Spinal mobilization and/or manipulation, Moist heat, Taping, Vasopneumatic device, Ionotophoresis 4mg /ml Dexamethasone, and Manual therapy   PLAN FOR NEXT SESSION: progressive loading of achilles, balance    Vanessa [place], PT, DPT 05/03/22 1:00 PM

## 2022-05-05 ENCOUNTER — Encounter: Payer: Self-pay | Admitting: Physical Therapy

## 2022-05-05 ENCOUNTER — Ambulatory Visit: Payer: Medicare PPO | Admitting: Physical Therapy

## 2022-05-05 DIAGNOSIS — M6281 Muscle weakness (generalized): Secondary | ICD-10-CM

## 2022-05-05 DIAGNOSIS — R2681 Unsteadiness on feet: Secondary | ICD-10-CM | POA: Diagnosis not present

## 2022-05-05 DIAGNOSIS — M25571 Pain in right ankle and joints of right foot: Secondary | ICD-10-CM

## 2022-05-05 DIAGNOSIS — R600 Localized edema: Secondary | ICD-10-CM | POA: Diagnosis not present

## 2022-05-05 NOTE — Therapy (Addendum)
OUTPATIENT PHYSICAL THERAPY TREATMENT NOTE   Patient Name: Taylor Ramirez MRN: [id number] DOB:01-10-1956, 66 y.o., female Today's Date: 05/05/2022  PCP: Dorothyann Peng, NP REFERRING PROVIDER: Criselda Peaches, DPM  END OF SESSION:   PT End of Session - 05/05/22 1214     Visit Number 7    Date for PT Re-Evaluation 06/02/22    Authorization Type UHC MCR    Authorization Time Period FOTO v6, v10, kx mod v15    Progress Note Due on Visit 10    Activity Tolerance Patient tolerated treatment well    Behavior During Therapy WFL for tasks assessed/performed           Time in: 11:30 Time out 12:11 Total time: 41 min    Past Medical History:  Diagnosis Date   Allergic rhinitis    Anemia    h/o   Anxiety    Asthma    Pulmonary function Tests 09-2006: mild asthma   Diabetes mellitus    Heart murmur    ECHO 5/10 was neg   Hypertension    Hypothyroidism    Premature surgical menopause on HRT    after hysterectomy 2011   Psychosis ([place]) 05/16/2014   Schizophrenia ([place])    Vaginal discharge 03/2015   Vaginal infection 03/2015   Past Surgical History:  Procedure Laterality Date   ABDOMINAL HYSTERECTOMY  2011   no oophorectomy   TONSILLECTOMY     Patient Active Problem List   Diagnosis Date Noted   Acanthosis nigricans 02/02/2022   Body mass index (BMI) 50.0-59.9, adult ([place]) 02/02/2022   Chronic pain 02/02/2022   Hyperlipidemia 02/02/2022   Moderate persistent asthma with (acute) exacerbation 02/02/2022   Morbid obesity ([place]) 02/02/2022   Papillomavirus as the cause of diseases classified elsewhere 02/02/2022   Skin ulcer ([place]) 02/02/2022   Tinea corporis 02/02/2022   Carpal tunnel syndrome 12/10/2019   Vaginitis 12/22/2017   Candidal vulvovaginitis 12/22/2017   Asthma 12/22/2017   Altered mental status    PCP NOTES >>>>>> 07/23/2015   Cardiac murmur 11/12/2014   Abnormal LFTs 05/22/2014   Decreased potassium in the blood 05/20/2014   Schizophrenia,  unspecified ([place]) 05/16/2014   DOE (dyspnea on exertion) 03/01/2013   Back sprain 01/17/2012   Foot pain 12/02/2011   General medical examination 07/04/2011   DJD (degenerative joint disease) 07/04/2011   Hypothyroidism 11/28/2006   DMII (diabetes mellitus, type 2) ([place]) 11/28/2006   ANEMIA-NOS 11/28/2006   HTN (hypertension) 11/28/2006   Allergic rhinitis 11/28/2006   ASTHMA 11/28/2006    REFERRING DIAG: Tendonitis, Achilles, right [M76.61]  THERAPY DIAG:  Pain in right ankle and joints of right foot  Muscle weakness  Unsteadiness on feet  Localized edema  Rationale for Evaluation and Treatment Rehabilitation  PERTINENT HISTORY: Asthma, DM TII, schizophrenia   SUBJECTIVE: Pt reports continued improvement.  "It helps to move it"  Pain:  Are you having pain? No Pain location: R achilles tendon NPRS scale:  current 4/10  Aggravating factors: walking, steps Relieving factors: rest Pain description: intermittent, sharp, and dull Stage: Chronic Stability: staying the same 24 hour pattern: worse with activity    OBJECTIVE: (objective measures completed at initial evaluation unless otherwise dated)   DIAGNOSTIC FINDINGS:  NA             GENERAL OBSERVATION/GAIT:                     Slow antalgic gait, reduced tim in stance R, forward flexed  posture in gait   SENSATION:          Light touch: Appears intact   PALPATION: TTP R achilles insertion; mild/moderate edema surrounding achilles insertion   LE MMT:   MMT Right 04/07/2022 Left 04/07/2022 Right 05/03/2022 Left 05/03/2022  Hip flexion (L2, L3) 3+ 3+ 4 4  Knee extension (L3) 3+* 4 4+ 5  Knee flexion 4 3+ 4+ 4  Hip abduction unable unable 4 4-  Hip extension        Hip external rotation        Hip internal rotation        Hip adduction        Ankle dorsiflexion (L4)        Ankle plantarflexion (S1) Partial ROM bil  Partial ROM bil    Ankle inversion        Ankle eversion        Great Toe ext (L5)         Grossly          (Blank rows = not tested, score listed is out of 5 possible points.  N = WNL, D = diminished, C = clear for gross weakness with myotome testing, * = concordant pain with testing)   LE ROM:   ROM Right 04/07/2022 Left 04/07/2022 A/PROM Right 05/03/2022 A/PROM Left 05/03/2022  Hip flexion        Hip extension        Hip abduction        Hip adduction        Hip internal rotation        Hip external rotation        Knee flexion        Knee extension        Ankle dorsiflexion To neutral w/ OP 5 degrees w/ OP 3/6 5/8  Ankle plantarflexion        Ankle inversion        Ankle eversion          (Blank rows = not tested, N = WNL, * = concordant pain with testing)   Functional Tests   Eval (04/07/2022)  05/03/2022    Progressive balance screen (highest level completed for >/= 10''):   Feet together: 10'' Semi Tandem: R in rear 5'', L in rear 10''   Feet together: 30 seconds (PT terminated)  Semi Tandem: 30 seconds BIL (PT terminated)     30'' STS: 5x  UE used? Ys 6x, No UE support                                                                                             PATIENT SURVEYS:  FOTO 21 -> 47  05/03/2022: 54%     TODAY'S TREATMENT:   [facility] Adult PT Treatment:                                                DATE: 05/05/2022 Therapeutic Exercise: Nustep level 4 x 6  mins Slant board gastroc stretch 2x1' Standing heel raise - 3x15 Seated ankle inversion with 7# cable to forefoot 2x10 BIL Seated ankle eversion with 7# cable to forefoot 2x10 BIL R seated heel raise - 25# on knee  Neuromuscular Re-ed: Feet together x45" EC Semi tandem x45" BIL Semi-tandem on foam  [facility] Adult PT Treatment:                                                DATE: 2022/05/16 Therapeutic Exercise:  Dead lift with two 8# dumbbells 2x10 Kickstand stance with front foot on edge of Airex pad with alternating lateral arm raises 2x20 BIL Manual  Therapy: N/A Neuromuscular re-ed: N/A Therapeutic Activity: Re-assessment of FOTO with pt education Re-assessment of objective measures with pt education Modalities: N/A Self Care: N/A   [facility] Adult PT Treatment:                                                DATE: 04/28/2022 Therapeutic Exercise: Nustep level 4 x 6 mins Slant board gastroc stretch 2x1' Heel raises on 2-inch step with UE support x5 with slow eccentric phase (pt sliding out of sandals) Seated heel raises 3x10 Seated toe raises 3x10 LAQ 3# 3x10 BIL Seated hamstring curl GTB 3x10 BIL Standing hip abduction with UE support 2x10 Neuromuscular Re-ed: Feet together x30" EC Semi tandem x30" BIL   [facility] Adult PT Treatment:                                                DATE: 04/26/2022 Therapeutic Exercise: Long-sitting plantar fascia/ calf stretch with sheet x35mn BIL Heel raises on 2-inch step with UE support 2x10 with slow eccentric phase Shuttle press heel raises 3x20 with 20# Seated ankle inversion with 7# cable to forefoot 2x10 BIL Seated ankle eversion with 7# cable to forefoot 2x10 BIL Standing slant board soleus stretch x242m Dead lift with 15# kettlebell 3x10 Manual Therapy: N/A Neuromuscular re-ed: N/A Therapeutic Activity: N/A Modalities: N/A Self Care: N/A      PATIENT EDUCATION:  POC, diagnosis, prognosis, HEP, and outcome measures.  Pt educated via explanation, demonstration, and handout (HEP).  Pt confirms understanding verbally.    HOME EXERCISE PROGRAM: Access Code: [email or web address]: [email or web address] Date: 04/07/2022 Prepared by: KaShearon Balo Exercises - Seated Toe Towel Scrunches  - 1 x daily - 7 x weekly - 3 sets - 10 reps - Ankle Inversion Eversion Towel Slide  - 1 x daily - 7 x weekly - 3 sets - 10 reps - Seated Ankle Plantar Flexion with Resistance Loop  - 1 x daily - 7 x weekly - 3 sets - 10 reps - 3 seconds hold   ASTERISK SIGNS     Asterisk Signs  Eval (04/07/2022)  9/28          R ankle DF neutral            Max pain 10/10  5/10          balance Semi tandem R in rear <10''  13'' STS 5x w/ UE support                               ASSESSMENT:   CLINICAL IMPRESSION: Reilley tolerated session well with no adverse reaction.  We concentrated on balance progression and standing exercises.  Pat fatigues globally with standing exercises but is able to complete with brief rests.  Overall shows objective and subjective improvement and we will continue current progression.    OBJECTIVE IMPAIRMENTS: Pain, R ankle ROM, general LE and hip weakness, balance   ACTIVITY LIMITATIONS: walking, standing, housework, ADLs like shopping   PERSONAL FACTORS: See medical history and pertinent history        GOALS:     SHORT TERM GOALS: Target date: 04/28/2022   Icelyn will be >75% HEP compliant to improve carryover between sessions and facilitate independent management of condition   Evaluation (04/07/2022): ongoing 05/03/2022: Pt reports adherence to HEP Goal status: ACHIEVED     LONG TERM GOALS: Target date: 06/02/2022   Dea will improve FOTO score to 47 as a proxy for functional improvement   Evaluation/Baseline (04/07/2022): 21 05/03/2022: 54% Goal status: ACHIEVED     2.  Adalay will self report >/= 50% decrease in pain from evaluation    Evaluation/Baseline (04/07/2022): 10/10 max pain 05/03/2022: 6/10 max pain Goal status: IN PROGRESS     3.  Joi will be able to stand for >30'' in semi tandem stance, to show a significant improvement in balance in order to reduce fall risk    Evaluation/Baseline (04/07/2022): 5'' R in rear 05/03/2022: 30" BIL Goal status: ACHIEVED     4.  Nataleigh will improve right DF to >/= 5 degrees   Evaluation/Baseline (04/07/2022): 0 degrees 05/03/2022: 3 degrees Goal status: IN PROGRESS         PLAN: PT FREQUENCY: 1-2x/week   PT DURATION: 8 weeks (Ending 06/02/2022)    PLANNED INTERVENTIONS: Therapeutic exercises, Aquatic therapy, Therapeutic activity, Neuro Muscular re-education, Gait training, Patient/Family education, Joint mobilization, Dry Needling, Electrical stimulation, Spinal mobilization and/or manipulation, Moist heat, Taping, Vasopneumatic device, Ionotophoresis '4mg'$ /ml Dexamethasone, and Manual therapy   PLAN FOR NEXT SESSION: progressive loading of achilles, balance    Kevan Ny Urian Martenson PT 05/05/22 1:00 PM

## 2022-05-10 ENCOUNTER — Ambulatory Visit (INDEPENDENT_AMBULATORY_CARE_PROVIDER_SITE_OTHER): Payer: Medicare PPO | Admitting: Podiatry

## 2022-05-10 DIAGNOSIS — M7661 Achilles tendinitis, right leg: Secondary | ICD-10-CM

## 2022-05-12 ENCOUNTER — Encounter: Payer: Self-pay | Admitting: Physical Therapy

## 2022-05-12 ENCOUNTER — Ambulatory Visit: Payer: Medicare PPO | Attending: Podiatry | Admitting: Physical Therapy

## 2022-05-12 DIAGNOSIS — M25571 Pain in right ankle and joints of right foot: Secondary | ICD-10-CM | POA: Insufficient documentation

## 2022-05-12 DIAGNOSIS — R2681 Unsteadiness on feet: Secondary | ICD-10-CM | POA: Insufficient documentation

## 2022-05-12 DIAGNOSIS — R600 Localized edema: Secondary | ICD-10-CM | POA: Insufficient documentation

## 2022-05-12 DIAGNOSIS — M6281 Muscle weakness (generalized): Secondary | ICD-10-CM | POA: Diagnosis not present

## 2022-05-12 NOTE — Therapy (Signed)
OUTPATIENT PHYSICAL THERAPY TREATMENT NOTE   Patient Name: Taylor Ramirez MRN: [id number] DOB:[birth year], 66 y.o., female Today's Date: 05/12/2022  PCP: Dorothyann Peng, NP REFERRING PROVIDER: Criselda Peaches, DPM  END OF SESSION:   PT End of Session - 05/12/22 1000     Visit Number 8    Date for PT Re-Evaluation 06/02/22    Authorization Type UHC MCR    Authorization Time Period FOTO v6, v10, kx mod v15    Progress Note Due on Visit 10    PT Start Time 1000    PT Stop Time 1041    PT Time Calculation (min) 41 min    Activity Tolerance Patient tolerated treatment well    Behavior During Therapy WFL for tasks assessed/performed               Past Medical History:  Diagnosis Date   Allergic rhinitis    Anemia    h/o   Anxiety    Asthma    Pulmonary function Tests 09-2006: mild asthma   Diabetes mellitus    Heart murmur    ECHO 5/10 was neg   Hypertension    Hypothyroidism    Premature surgical menopause on HRT    after hysterectomy 2011   Psychosis ([place]) 05/16/2014   Schizophrenia ([place])    Vaginal discharge 03/2015   Vaginal infection 03/2015   Past Surgical History:  Procedure Laterality Date   ABDOMINAL HYSTERECTOMY  2011   no oophorectomy   TONSILLECTOMY     Patient Active Problem List   Diagnosis Date Noted   Acanthosis nigricans 02/02/2022   Body mass index (BMI) 50.0-59.9, adult ([place]) 02/02/2022   Chronic pain 02/02/2022   Hyperlipidemia 02/02/2022   Moderate persistent asthma with (acute) exacerbation 02/02/2022   Morbid obesity ([place]) 02/02/2022   Papillomavirus as the cause of diseases classified elsewhere 02/02/2022   Skin ulcer ([place]) 02/02/2022   Tinea corporis 02/02/2022   Carpal tunnel syndrome 12/10/2019   Vaginitis 12/22/2017   Candidal vulvovaginitis 12/22/2017   Asthma 12/22/2017   Altered mental status    PCP NOTES >>>>>> 07/23/2015   Cardiac murmur 11/12/2014   Abnormal LFTs 05/22/2014   Decreased potassium in the blood  05/20/2014   Schizophrenia, unspecified ([place]) 05/16/2014   DOE (dyspnea on exertion) 03/01/2013   Back sprain 01/17/2012   Foot pain 12/02/2011   General medical examination 07/04/2011   DJD (degenerative joint disease) 07/04/2011   Hypothyroidism 11/28/2006   DMII (diabetes mellitus, type 2) ([place]) 11/28/2006   ANEMIA-NOS 11/28/2006   HTN (hypertension) 11/28/2006   Allergic rhinitis 11/28/2006   ASTHMA 11/28/2006    REFERRING DIAG: Tendonitis, Achilles, right [M76.61]  THERAPY DIAG:  Pain in right ankle and joints of right foot  Muscle weakness  Unsteadiness on feet  Localized edema  Rationale for Evaluation and Treatment Rehabilitation  PERTINENT HISTORY: Asthma, DM TII, schizophrenia   SUBJECTIVE: Pt reports that she has continued to see slow, but steady improvement in her R achilles pain.  Pain:  Are you having pain? No Pain location: R achilles tendon NPRS scale:  current 4/10  Aggravating factors: walking, steps Relieving factors: rest Pain description: intermittent, sharp, and dull Stage: Chronic Stability: staying the same 24 hour pattern: worse with activity    OBJECTIVE: (objective measures completed at initial evaluation unless otherwise dated)   DIAGNOSTIC FINDINGS:  NA             GENERAL OBSERVATION/GAIT:  Slow antalgic gait, reduced tim in stance R, forward flexed posture in gait   SENSATION:          Light touch: Appears intact   PALPATION: TTP R achilles insertion; mild/moderate edema surrounding achilles insertion   LE MMT:   MMT Right 04/07/2022 Left 04/07/2022 Right 05/03/2022 Left 05/03/2022  Hip flexion (L2, L3) 3+ 3+ 4 4  Knee extension (L3) 3+* 4 4+ 5  Knee flexion 4 3+ 4+ 4  Hip abduction unable unable 4 4-  Hip extension        Hip external rotation        Hip internal rotation        Hip adduction        Ankle dorsiflexion (L4)        Ankle plantarflexion (S1) Partial ROM bil  Partial ROM bil     Ankle inversion        Ankle eversion        Great Toe ext (L5)        Grossly          (Blank rows = not tested, score listed is out of 5 possible points.  N = WNL, D = diminished, C = clear for gross weakness with myotome testing, * = concordant pain with testing)   LE ROM:   ROM Right 04/07/2022 Left 04/07/2022 A/PROM Right 05/03/2022 A/PROM Left 05/03/2022  Hip flexion        Hip extension        Hip abduction        Hip adduction        Hip internal rotation        Hip external rotation        Knee flexion        Knee extension        Ankle dorsiflexion To neutral w/ OP 5 degrees w/ OP 3/6 5/8  Ankle plantarflexion        Ankle inversion        Ankle eversion          (Blank rows = not tested, N = WNL, * = concordant pain with testing)   Functional Tests   Eval (04/07/2022)  05/03/2022    Progressive balance screen (highest level completed for >/= 10''):   Feet together: 10'' Semi Tandem: R in rear 5'', L in rear 10''   Feet together: 30 seconds (PT terminated)  Semi Tandem: 30 seconds BIL (PT terminated)     30'' STS: 5x  UE used? Ys 6x, No UE support                                                                                             PATIENT SURVEYS:  FOTO 21 -> 47  05/03/2022: 54%     TODAY'S TREATMENT:  [facility] Adult PT Treatment:  DATE: 05/12/2022 Therapeutic Exercise: Nustep level 5 x 6 mins Slant board gastroc stretch 2x1' Standing heel raise - 2'' step - 3x15 Seated ankle inversion with 10# cable to forefoot 2x10 BIL Seated ankle eversion with 10# cable to forefoot 2x10 BIL Seated heel raise - 25# on knee - on step - 2x15 ea  Neuromuscular Re-ed: Feet together x45" EC Semi-tandem on foam (75%) - 45'' bouts Wooden rocker board DF/PF - 2'  [facility] Adult PT Treatment:                                                DATE: 05/05/2022 Therapeutic Exercise: Nustep level 4 x 6 mins Slant  board gastroc stretch 2x1' Standing heel raise - 3x15 Seated ankle inversion with 7# cable to forefoot 2x10 BIL Seated ankle eversion with 7# cable to forefoot 2x10 BIL R seated heel raise - 25# on knee  Neuromuscular Re-ed: Feet together x45" EC Semi tandem x45" BIL Semi-tandem on foam  [facility] Adult PT Treatment:                                                DATE: 2022-05-07 Therapeutic Exercise:  Dead lift with two 8# dumbbells 2x10 Kickstand stance with front foot on edge of Airex pad with alternating lateral arm raises 2x20 BIL Manual Therapy: N/A Neuromuscular re-ed: N/A Therapeutic Activity: Re-assessment of FOTO with pt education Re-assessment of objective measures with pt education Modalities: N/A Self Care: N/A   [facility] Adult PT Treatment:                                                DATE: 04/28/2022 Therapeutic Exercise: Nustep level 4 x 6 mins Slant board gastroc stretch 2x1' Heel raises on 2-inch step with UE support x5 with slow eccentric phase (pt sliding out of sandals) Seated heel raises 3x10 Seated toe raises 3x10 LAQ 3# 3x10 BIL Seated hamstring curl GTB 3x10 BIL Standing hip abduction with UE support 2x10 Neuromuscular Re-ed: Feet together x30" EC Semi tandem x30" BIL   [facility] Adult PT Treatment:                                                DATE: 04/26/2022 Therapeutic Exercise: Long-sitting plantar fascia/ calf stretch with sheet x52mn BIL Heel raises on 2-inch step with UE support 2x10 with slow eccentric phase Shuttle press heel raises 3x20 with 20# Seated ankle inversion with 7# cable to forefoot 2x10 BIL Seated ankle eversion with 7# cable to forefoot 2x10 BIL Standing slant board soleus stretch x216m Dead lift with 15# kettlebell 3x10 Manual Therapy: N/A Neuromuscular re-ed: N/A Therapeutic Activity: N/A Modalities: N/A Self Care: N/A      PATIENT EDUCATION:  POC, diagnosis, prognosis, HEP, and outcome measures.  Pt educated via  explanation, demonstration, and handout (HEP).  Pt confirms understanding verbally.    HOME EXERCISE PROGRAM: Access Code: [email or web address]: [email or web address] Date: 04/07/2022 Prepared by: KaShearon Balo  Exercises - Seated Toe Towel Scrunches  - 1 x daily - 7 x weekly - 3 sets - 10 reps - Ankle Inversion Eversion Towel Slide  - 1 x daily - 7 x weekly - 3 sets - 10 reps - Seated Ankle Plantar Flexion with Resistance Loop  - 1 x daily - 7 x weekly - 3 sets - 10 reps - 3 seconds hold   ASTERISK SIGNS     Asterisk Signs Eval (04/07/2022)  9/28 10/5         R ankle DF neutral            Max pain 10/10  5/10          balance Semi tandem R in rear <10''   >20'' bil         30'' STS 5x w/ UE support                               ASSESSMENT:   CLINICAL IMPRESSION: Hara tolerated session well with no adverse reaction.  We concentrated on balance and progressive loading of G/S complex.  Step added for seated soleus heel raise to increase ROM with no issue.  Will continue to progress as tolerated.    OBJECTIVE IMPAIRMENTS: Pain, R ankle ROM, general LE and hip weakness, balance   ACTIVITY LIMITATIONS: walking, standing, housework, ADLs like shopping   PERSONAL FACTORS: See medical history and pertinent history        GOALS:     SHORT TERM GOALS: Target date: 04/28/2022   Lariza will be >75% HEP compliant to improve carryover between sessions and facilitate independent management of condition   Evaluation (04/07/2022): ongoing 05/03/2022: Pt reports adherence to HEP Goal status: ACHIEVED     LONG TERM GOALS: Target date: 06/02/2022   Kendallyn will improve FOTO score to 47 as a proxy for functional improvement   Evaluation/Baseline (04/07/2022): 21 05/03/2022: 54% Goal status: ACHIEVED     2.  Aniyah will self report >/= 50% decrease in pain from evaluation    Evaluation/Baseline (04/07/2022): 10/10 max pain 05/03/2022: 6/10 max pain Goal status: IN  PROGRESS     3.  Claribel will be able to stand for >30'' in semi tandem stance, to show a significant improvement in balance in order to reduce fall risk    Evaluation/Baseline (04/07/2022): 5'' R in rear 05/03/2022: 30" BIL Goal status: ACHIEVED     4.  Anthony will improve right DF to >/= 5 degrees   Evaluation/Baseline (04/07/2022): 0 degrees 05/03/2022: 3 degrees Goal status: IN PROGRESS         PLAN: PT FREQUENCY: 1-2x/week   PT DURATION: 8 weeks (Ending 06/02/2022)   PLANNED INTERVENTIONS: Therapeutic exercises, Aquatic therapy, Therapeutic activity, Neuro Muscular re-education, Gait training, Patient/Family education, Joint mobilization, Dry Needling, Electrical stimulation, Spinal mobilization and/or manipulation, Moist heat, Taping, Vasopneumatic device, Ionotophoresis '4mg'$ /ml Dexamethasone, and Manual therapy   PLAN FOR NEXT SESSION: progressive loading of achilles, balance    Kevan Ny Jessey Stehlin PT 05/12/22 10:43 AM

## 2022-05-12 NOTE — Progress Notes (Signed)
  Subjective:  Patient ID: Taylor Ramirez, female    DOB: 24-May-1956,  MRN: [id number]  Chief Complaint  Patient presents with   Tendonitis    2 month follow up achilles tendonitis right - still has a little pain, but is much better    66 y.o. female presents with the above complaint. History confirmed with patient.  Has had quite a bit of improvement  Objective:  Physical Exam: warm, good capillary refill, no trophic changes or ulcerative lesions, normal DP and PT pulses, and normal sensory exam. Left Foot: normal exam, no swelling, tenderness, instability; ligaments intact, full range of motion of all ankle/foot joints Right Foot: ROM improved greatly, 5/5 PF strength, no POP to Achilles or insertion   No images are attached to the encounter.  Radiographs: Multiple views x-ray of the right foot: no fracture, dislocation, swelling or degenerative changes noted and posterior calcaneal spur Assessment:   1. Tendonitis, Achilles, right      Plan:  Patient was evaluated and treated and all questions answered.  Discussed the etiology and treatment options for Achilles tendinitis including stretching, formal physical therapy with an eccentric exercises therapy plan, supportive shoegears such as a running shoe or sneaker, heel lifts, topical and oral medications.  We also discussed that I do not routinely perform injections in this area because of the risk of an increased damage or rupture of the tendon.  We also discussed the role of surgical treatment of this for patients who do not improve after exhausting non-surgical treatment options.  Nearly 100%. She will complete her PT and continue home exercise plan. Return PRN if worsens or does not resolve    Return if symptoms worsen or fail to improve.

## 2022-05-17 ENCOUNTER — Ambulatory Visit: Payer: Medicare PPO | Admitting: Physical Therapy

## 2022-05-17 ENCOUNTER — Encounter: Payer: Self-pay | Admitting: Physical Therapy

## 2022-05-17 DIAGNOSIS — R2681 Unsteadiness on feet: Secondary | ICD-10-CM | POA: Diagnosis not present

## 2022-05-17 DIAGNOSIS — R600 Localized edema: Secondary | ICD-10-CM

## 2022-05-17 DIAGNOSIS — M25571 Pain in right ankle and joints of right foot: Secondary | ICD-10-CM

## 2022-05-17 DIAGNOSIS — M6281 Muscle weakness (generalized): Secondary | ICD-10-CM | POA: Diagnosis not present

## 2022-05-17 NOTE — Therapy (Signed)
OUTPATIENT PHYSICAL THERAPY TREATMENT NOTE   Patient Name: Taylor Ramirez MRN: [id number] DOB:04/15/1956, 66 y.o., female Today's Date: 05/17/2022  PCP: Dorothyann Peng, NP REFERRING PROVIDER: Criselda Peaches, DPM  END OF SESSION:   PT End of Session - 05/17/22 1129     Visit Number 9    Date for PT Re-Evaluation 06/02/22    Authorization Type UHC MCR    Authorization Time Period FOTO v6, v10, kx mod v15    Progress Note Due on Visit 10    PT Start Time 1131    PT Stop Time 1211    PT Time Calculation (min) 40 min    Activity Tolerance Patient tolerated treatment well    Behavior During Therapy WFL for tasks assessed/performed               Past Medical History:  Diagnosis Date   Allergic rhinitis    Anemia    h/o   Anxiety    Asthma    Pulmonary function Tests 09-2006: mild asthma   Diabetes mellitus    Heart murmur    ECHO 5/10 was neg   Hypertension    Hypothyroidism    Premature surgical menopause on HRT    after hysterectomy 2011   Psychosis ([place]) 05/16/2014   Schizophrenia ([place])    Vaginal discharge 03/2015   Vaginal infection 03/2015   Past Surgical History:  Procedure Laterality Date   ABDOMINAL HYSTERECTOMY  2011   no oophorectomy   TONSILLECTOMY     Patient Active Problem List   Diagnosis Date Noted   Acanthosis nigricans 02/02/2022   Body mass index (BMI) 50.0-59.9, adult ([place]) 02/02/2022   Chronic pain 02/02/2022   Hyperlipidemia 02/02/2022   Moderate persistent asthma with (acute) exacerbation 02/02/2022   Morbid obesity ([place]) 02/02/2022   Papillomavirus as the cause of diseases classified elsewhere 02/02/2022   Skin ulcer ([place]) 02/02/2022   Tinea corporis 02/02/2022   Carpal tunnel syndrome 12/10/2019   Vaginitis 12/22/2017   Candidal vulvovaginitis 12/22/2017   Asthma 12/22/2017   Altered mental status    PCP NOTES >>>>>> 07/23/2015   Cardiac murmur 11/12/2014   Abnormal LFTs 05/22/2014   Decreased potassium in the blood  05/20/2014   Schizophrenia, unspecified ([place]) 05/16/2014   DOE (dyspnea on exertion) 03/01/2013   Back sprain 01/17/2012   Foot pain 12/02/2011   General medical examination 07/04/2011   DJD (degenerative joint disease) 07/04/2011   Hypothyroidism 11/28/2006   DMII (diabetes mellitus, type 2) ([place]) 11/28/2006   ANEMIA-NOS 11/28/2006   HTN (hypertension) 11/28/2006   Allergic rhinitis 11/28/2006   ASTHMA 11/28/2006    REFERRING DIAG: Tendonitis, Achilles, right [M76.61]  THERAPY DIAG:  Pain in right ankle and joints of right foot  Muscle weakness  Unsteadiness on feet  Localized edema  Rationale for Evaluation and Treatment Rehabilitation  PERTINENT HISTORY: Asthma, DM TII, schizophrenia   SUBJECTIVE: Pt reports that her ankle pain is "much better than when I first came"  Pain:  Are you having pain? No Pain location: R achilles tendon NPRS scale:  current 3/10  Aggravating factors: walking, steps Relieving factors: rest Pain description: intermittent, sharp, and dull Stage: Chronic Stability: staying the same 24 hour pattern: worse with activity    OBJECTIVE: (objective measures completed at initial evaluation unless otherwise dated)   DIAGNOSTIC FINDINGS:  NA             GENERAL OBSERVATION/GAIT:  Slow antalgic gait, reduced tim in stance R, forward flexed posture in gait   SENSATION:          Light touch: Appears intact   PALPATION: TTP R achilles insertion; mild/moderate edema surrounding achilles insertion   LE MMT:   MMT Right 04/07/2022 Left 04/07/2022 Right 05/03/2022 Left 05/03/2022  Hip flexion (L2, L3) 3+ 3+ 4 4  Knee extension (L3) 3+* 4 4+ 5  Knee flexion 4 3+ 4+ 4  Hip abduction unable unable 4 4-  Hip extension        Hip external rotation        Hip internal rotation        Hip adduction        Ankle dorsiflexion (L4)        Ankle plantarflexion (S1) Partial ROM bil  Partial ROM bil    Ankle inversion         Ankle eversion        Great Toe ext (L5)        Grossly          (Blank rows = not tested, score listed is out of 5 possible points.  N = WNL, D = diminished, C = clear for gross weakness with myotome testing, * = concordant pain with testing)   LE ROM:   ROM Right 04/07/2022 Left 04/07/2022 A/PROM Right 05/03/2022 A/PROM Left 05/03/2022  Hip flexion        Hip extension        Hip abduction        Hip adduction        Hip internal rotation        Hip external rotation        Knee flexion        Knee extension        Ankle dorsiflexion To neutral w/ OP 5 degrees w/ OP 3/6 5/8  Ankle plantarflexion        Ankle inversion        Ankle eversion          (Blank rows = not tested, N = WNL, * = concordant pain with testing)   Functional Tests   Eval (04/07/2022)  05/03/2022    Progressive balance screen (highest level completed for >/= 10''):   Feet together: 10'' Semi Tandem: R in rear 5'', L in rear 10''   Feet together: 30 seconds (PT terminated)  Semi Tandem: 30 seconds BIL (PT terminated)     30'' STS: 5x  UE used? Ys 6x, No UE support                                                                                             PATIENT SURVEYS:   FOTO 21 -> 47  05/03/2022: 54%     TODAY'S TREATMENT:  [facility] Adult PT Treatment:  DATE: 05/17/2022 Therapeutic Exercise: Nustep level 5 x 6 mins Slant board gastroc stretch 2x1' Standing heel raise - 2'' step - 3x20 2'' step down - 2x10 ea Seated heel raise - 25# on knee - on step - 2x20 ea  Sit to stand w/ UE support 10x  Neuromuscular Re-ed: Feet together x45" EC Semi-tandem on foam (100%) - 45'' bouts Wooden rocker board DF/PF - 2'  [facility] Adult PT Treatment:                                                DATE: 05/12/2022 Therapeutic Exercise: Nustep level 5 x 6 mins Slant board gastroc stretch 2x1' Standing heel raise - 2'' step - 3x15 Seated ankle  inversion with 10# cable to forefoot 2x10 BIL Seated ankle eversion with 10# cable to forefoot 2x10 BIL Seated heel raise - 25# on knee - on step - 2x15 ea  Neuromuscular Re-ed: Feet together x45" EC Semi-tandem on foam (75%) - 45'' bouts Wooden rocker board DF/PF - 2'     PATIENT EDUCATION:  POC, diagnosis, prognosis, HEP, and outcome measures.  Pt educated via explanation, demonstration, and handout (HEP).  Pt confirms understanding verbally.    HOME EXERCISE PROGRAM: Access Code: [email or web address] URL: [email or web address] Date: 04/07/2022 Prepared by: Shearon Balo   Exercises - Seated Toe Towel Scrunches  - 1 x daily - 7 x weekly - 3 sets - 10 reps - Ankle Inversion Eversion Towel Slide  - 1 x daily - 7 x weekly - 3 sets - 10 reps - Seated Ankle Plantar Flexion with Resistance Loop  - 1 x daily - 7 x weekly - 3 sets - 10 reps - 3 seconds hold   ASTERISK SIGNS     Asterisk Signs Eval (04/07/2022)  9/28 10/5         R ankle DF neutral            Max pain 10/10  5/10          balance Semi tandem R in rear <10''   >20'' bil         30'' STS 5x w/ UE support                               ASSESSMENT:   CLINICAL IMPRESSION: Brittanyann tolerated session well with no adverse reaction.  We concentrated on balance and progressive loading of G/S complex.  She is improving her tolerance to standing heel raise and shows overall decrease in baseline pain.  Able to progress to semi tandem (100%) on foam today.  We will plan on D/C next visit.    OBJECTIVE IMPAIRMENTS: Pain, R ankle ROM, general LE and hip weakness, balance   ACTIVITY LIMITATIONS: walking, standing, housework, ADLs like shopping   PERSONAL FACTORS: See medical history and pertinent history        GOALS:     SHORT TERM GOALS: Target date: 04/28/2022   Cathaleen will be >75% HEP compliant to improve carryover between sessions and facilitate independent management of condition   Evaluation (04/07/2022):  ongoing 05/03/2022: Pt reports adherence to HEP Goal status: ACHIEVED     LONG TERM GOALS: Target date: 06/02/2022   Arisha will improve FOTO score to 47 as a proxy for functional improvement   Evaluation/Baseline (04/07/2022): 21 05/03/2022:  54% Goal status: ACHIEVED     2.  Marshe will self report >/= 50% decrease in pain from evaluation    Evaluation/Baseline (04/07/2022): 10/10 max pain 05/03/2022: 6/10 max pain 10/10: 65% improvement ~3/10 pain  Goal status: MET     3.  Alona will be able to stand for >30'' in semi tandem stance, to show a significant improvement in balance in order to reduce fall risk    Evaluation/Baseline (04/07/2022): 5'' R in rear 05/03/2022: 30" BIL Goal status: ACHIEVED     4.  Tessi will improve right DF to >/= 5 degrees   Evaluation/Baseline (04/07/2022): 0 degrees 05/03/2022: 3 degrees Goal status: IN PROGRESS         PLAN: PT FREQUENCY: 1-2x/week   PT DURATION: 8 weeks (Ending 06/02/2022)   PLANNED INTERVENTIONS: Therapeutic exercises, Aquatic therapy, Therapeutic activity, Neuro Muscular re-education, Gait training, Patient/Family education, Joint mobilization, Dry Needling, Electrical stimulation, Spinal mobilization and/or manipulation, Moist heat, Taping, Vasopneumatic device, Ionotophoresis 4mg /ml Dexamethasone, and Manual therapy   PLAN FOR NEXT SESSION: progressive loading of achilles, balance    Kevan Ny Jowanda Heeg PT 05/17/22 12:13 PM

## 2022-05-19 ENCOUNTER — Encounter: Payer: Self-pay | Admitting: Physical Therapy

## 2022-05-19 ENCOUNTER — Ambulatory Visit: Payer: Medicare PPO | Admitting: Physical Therapy

## 2022-05-19 DIAGNOSIS — M25571 Pain in right ankle and joints of right foot: Secondary | ICD-10-CM

## 2022-05-19 DIAGNOSIS — R2681 Unsteadiness on feet: Secondary | ICD-10-CM

## 2022-05-19 DIAGNOSIS — R600 Localized edema: Secondary | ICD-10-CM

## 2022-05-19 DIAGNOSIS — M6281 Muscle weakness (generalized): Secondary | ICD-10-CM | POA: Diagnosis not present

## 2022-05-19 NOTE — Therapy (Signed)
Progress Note Reporting Period 8/31 to 10/12  See note below for Objective Data and Assessment of Progress/Goals.    Patient Name: Taylor Ramirez MRN: [id number] DOB:05/27/1956, 66 y.o., female Today's Date: 05/19/2022  PCP: Dorothyann Peng, NP REFERRING PROVIDER: Criselda Peaches, DPM  END OF SESSION:   PT End of Session - 05/19/22 1003     Visit Number 10    Date for PT Re-Evaluation 06/02/22    Authorization Type UHC MCR    Authorization Time Period FOTO v6, v10, kx mod v15    Progress Note Due on Visit 10    PT Start Time 1000    PT Stop Time 1041    PT Time Calculation (min) 41 min    Activity Tolerance Patient tolerated treatment well    Behavior During Therapy WFL for tasks assessed/performed               Past Medical History:  Diagnosis Date   Allergic rhinitis    Anemia    h/o   Anxiety    Asthma    Pulmonary function Tests 09-2006: mild asthma   Diabetes mellitus    Heart murmur    ECHO 5/10 was neg   Hypertension    Hypothyroidism    Premature surgical menopause on HRT    after hysterectomy 2011   Psychosis ([place]) 05/16/2014   Schizophrenia ([place])    Vaginal discharge 03/2015   Vaginal infection 03/2015   Past Surgical History:  Procedure Laterality Date   ABDOMINAL HYSTERECTOMY  2011   no oophorectomy   TONSILLECTOMY     Patient Active Problem List   Diagnosis Date Noted   Acanthosis nigricans 02/02/2022   Body mass index (BMI) 50.0-59.9, adult ([place]) 02/02/2022   Chronic pain 02/02/2022   Hyperlipidemia 02/02/2022   Moderate persistent asthma with (acute) exacerbation 02/02/2022   Morbid obesity ([place]) 02/02/2022   Papillomavirus as the cause of diseases classified elsewhere 02/02/2022   Skin ulcer ([place]) 02/02/2022   Tinea corporis 02/02/2022   Carpal tunnel syndrome 12/10/2019   Vaginitis 12/22/2017   Candidal vulvovaginitis 12/22/2017   Asthma 12/22/2017   Altered mental status    PCP NOTES >>>>>> 07/23/2015   Cardiac murmur  11/12/2014   Abnormal LFTs 05/22/2014   Decreased potassium in the blood 05/20/2014   Schizophrenia, unspecified ([place]) 05/16/2014   DOE (dyspnea on exertion) 03/01/2013   Back sprain 01/17/2012   Foot pain 12/02/2011   General medical examination 07/04/2011   DJD (degenerative joint disease) 07/04/2011   Hypothyroidism 11/28/2006   DMII (diabetes mellitus, type 2) ([place]) 11/28/2006   ANEMIA-NOS 11/28/2006   HTN (hypertension) 11/28/2006   Allergic rhinitis 11/28/2006   ASTHMA 11/28/2006    REFERRING DIAG: Tendonitis, Achilles, right [M76.61]  THERAPY DIAG:  Pain in right ankle and joints of right foot  Muscle weakness  Unsteadiness on feet  Localized edema  Rationale for Evaluation and Treatment Rehabilitation  PERTINENT HISTORY: Asthma, DM TII, schizophrenia   SUBJECTIVE: Pt reports that she has seen significant improvement with PT and wonders if she can continue for 2 more visits.  Pain:  Are you having pain? No Pain location: R achilles tendon NPRS scale:  current 3/10  Aggravating factors: walking, steps Relieving factors: rest Pain description: intermittent, sharp, and dull Stage: Chronic Stability: staying the same 24 hour pattern: worse with activity    OBJECTIVE:  HOME EXERCISE PROGRAM: Access Code: [email or web address] URL: [email or web address] Date: 04/07/2022 Prepared by: Shearon Balo   Exercises - Seated Toe  Towel Scrunches  - 1 x daily - 7 x weekly - 3 sets - 10 reps - Ankle Inversion Eversion Towel Slide  - 1 x daily - 7 x weekly - 3 sets - 10 reps - Seated Ankle Plantar Flexion with Resistance Loop  - 1 x daily - 7 x weekly - 3 sets - 10 reps - 3 seconds hold   ASTERISK SIGNS     Asterisk Signs Eval (04/07/2022)  9/28 10/5  10/12       R ankle DF neutral     4 degrees       Max pain 10/10  5/10          balance Semi tandem R in rear <10''   >20'' bil   semi tandem on foam >20''      30'' STS 5x w/ UE support     6x w/ UE support                            PATIENT SURVEYS:   FOTO 21 -> 47  05/03/2022: 54%     TODAY'S TREATMENT:  [facility] Adult PT Treatment:                                                DATE: 05/19/2022 Therapeutic Exercise: Nustep level 5 x 6 mins Slant board gastroc stretch 2x1' Standing heel raise - 2'' step - 3x20 2'' step down - 2x10 ea Seated heel raise - 25# on knee - on step - 3x20 ea  Sit to stand w/ UE support 2x10 Lateral walking with band at counter - 4 laps - Green TB  Neuromuscular Re-ed: Semi-tandem on foam (100%) - 45'' bouts Tandem stance - 45'' bouts Wooden rocker board DF/PF - 2'  [facility] Adult PT Treatment:                                                DATE: 05/12/2022 Therapeutic Exercise: Nustep level 5 x 6 mins Slant board gastroc stretch 2x1' Standing heel raise - 2'' step - 3x15 Seated ankle inversion with 10# cable to forefoot 2x10 BIL Seated ankle eversion with 10# cable to forefoot 2x10 BIL Seated heel raise - 25# on knee - on step - 2x15 ea  Neuromuscular Re-ed: Feet together x45" EC Semi-tandem on foam (75%) - 19'' bouts Wooden rocker board DF/PF - 2'     PATIENT EDUCATION:  POC, diagnosis, prognosis, HEP, and outcome measures.  Pt educated via explanation, demonstration, and handout (HEP).  Pt confirms understanding verbally.     ASSESSMENT:   CLINICAL IMPRESSION: Taylor Ramirez has progressed well with therapy.  Improved impairments include: DF ROM, ankle pain, balance, LE strength.  Functional improvements include: improved stability in gait, improved FOTO score (as proxy for functional improvement), improved transfers.  Progressions needed include: continued work on strength progression, balance, and gait.  Barriers to progress include: chronic condition.  Please see GOALS section for progress on short term and long term goals established at evaluation.  I recommend continuation of PT to allow completion of remaining goals and continued functional  progression.    OBJECTIVE IMPAIRMENTS: Pain, R ankle ROM, general LE and hip  weakness, balance   ACTIVITY LIMITATIONS: walking, standing, housework, ADLs like shopping   PERSONAL FACTORS: See medical history and pertinent history        GOALS:     SHORT TERM GOALS: Target date: 04/28/2022   Taylor Ramirez will be >75% HEP compliant to improve carryover between sessions and facilitate independent management of condition   Evaluation (04/07/2022): ongoing 05/03/2022: Pt reports adherence to HEP Goal status: ACHIEVED     LONG TERM GOALS: Target date: 06/02/2022   Taylor Ramirez will improve FOTO score to 47 as a proxy for functional improvement   Evaluation/Baseline (04/07/2022): 21 05/03/2022: 54% Goal status: ACHIEVED     2.  Taylor Ramirez will self report >/= 50% decrease in pain from evaluation    Evaluation/Baseline (04/07/2022): 10/10 max pain 05/03/2022: 6/10 max pain 10/10: 65% improvement ~3/10 pain  Goal status: MET     3.  Taylor Ramirez will be able to stand for >30'' in semi tandem stance, to show a significant improvement in balance in order to reduce fall risk    Evaluation/Baseline (04/07/2022): 5'' R in rear 05/03/2022: 30" BIL Goal status: ACHIEVED     4.  Taylor Ramirez will improve right DF to >/= 5 degrees   Evaluation/Baseline (04/07/2022): 0 degrees 05/03/2022: 3 degrees 10/12: 4 degrees Goal status: IN PROGRESS         PLAN: PT FREQUENCY: 1-2x/week   PT DURATION: 8 weeks (Ending 06/02/2022)   PLANNED INTERVENTIONS: Therapeutic exercises, Aquatic therapy, Therapeutic activity, Neuro Muscular re-education, Gait training, Patient/Family education, Joint mobilization, Dry Needling, Electrical stimulation, Spinal mobilization and/or manipulation, Moist heat, Taping, Vasopneumatic device, Ionotophoresis 4mg /ml Dexamethasone, and Manual therapy   PLAN FOR NEXT SESSION: progressive loading of achilles, balance    Kevan Ny Leotis Isham PT 05/19/22 10:51 AM

## 2022-05-25 NOTE — Therapy (Signed)
OUTPATIENT PHYSICAL THERAPY TREATMENT NOTE    Patient Name: Taylor Ramirez MRN: [id number] DOB:[birth year], 66 y.o., female Today's Date: 05/26/2022  PCP: Dorothyann Peng, NP REFERRING PROVIDER: Criselda Peaches, DPM  END OF SESSION:   PT End of Session - 05/26/22 1212     Visit Number 11    Date for PT Re-Evaluation 06/02/22    Authorization Type UHC MCR    Authorization Time Period FOTO v6, v10, kx mod v15    Progress Note Due on Visit 10    PT Start Time 1215    PT Stop Time 1253    PT Time Calculation (min) 38 min    Activity Tolerance Patient tolerated treatment well    Behavior During Therapy WFL for tasks assessed/performed                Past Medical History:  Diagnosis Date   Allergic rhinitis    Anemia    h/o   Anxiety    Asthma    Pulmonary function Tests 09-2006: mild asthma   Diabetes mellitus    Heart murmur    ECHO 5/10 was neg   Hypertension    Hypothyroidism    Premature surgical menopause on HRT    after hysterectomy 2011   Psychosis ([place]) 05/16/2014   Schizophrenia ([place])    Vaginal discharge 03/2015   Vaginal infection 03/2015   Past Surgical History:  Procedure Laterality Date   ABDOMINAL HYSTERECTOMY  2011   no oophorectomy   TONSILLECTOMY     Patient Active Problem List   Diagnosis Date Noted   Acanthosis nigricans 02/02/2022   Body mass index (BMI) 50.0-59.9, adult ([place]) 02/02/2022   Chronic pain 02/02/2022   Hyperlipidemia 02/02/2022   Moderate persistent asthma with (acute) exacerbation 02/02/2022   Morbid obesity ([place]) 02/02/2022   Papillomavirus as the cause of diseases classified elsewhere 02/02/2022   Skin ulcer ([place]) 02/02/2022   Tinea corporis 02/02/2022   Carpal tunnel syndrome 12/10/2019   Vaginitis 12/22/2017   Candidal vulvovaginitis 12/22/2017   Asthma 12/22/2017   Altered mental status    PCP NOTES >>>>>> 07/23/2015   Cardiac murmur 11/12/2014   Abnormal LFTs 05/22/2014   Decreased potassium in the  blood 05/20/2014   Schizophrenia, unspecified ([place]) 05/16/2014   DOE (dyspnea on exertion) 03/01/2013   Back sprain 01/17/2012   Foot pain 12/02/2011   General medical examination 07/04/2011   DJD (degenerative joint disease) 07/04/2011   Hypothyroidism 11/28/2006   DMII (diabetes mellitus, type 2) ([place]) 11/28/2006   ANEMIA-NOS 11/28/2006   HTN (hypertension) 11/28/2006   Allergic rhinitis 11/28/2006   ASTHMA 11/28/2006    REFERRING DIAG: Tendonitis, Achilles, right [M76.61]  THERAPY DIAG:  Pain in right ankle and joints of right foot  Muscle weakness  Unsteadiness on feet  Localized edema  Rationale for Evaluation and Treatment Rehabilitation  PERTINENT HISTORY: Asthma, DM TII, schizophrenia   SUBJECTIVE: Patient reports she has noticed that she is able to walk better.  Pain:  Are you having pain? No Pain location: R achilles tendon NPRS scale:  current 3/10  Aggravating factors: walking, steps Relieving factors: rest Pain description: intermittent, sharp, and dull Stage: Chronic Stability: staying the same 24 hour pattern: worse with activity    OBJECTIVE:  HOME EXERCISE PROGRAM: Access Code: [email or web address] URL: [email or web address] Date: 04/07/2022 Prepared by: Shearon Balo   Exercises - Seated Toe Towel Scrunches  - 1 x daily - 7 x weekly - 3 sets - 10 reps - Ankle  Inversion Eversion Towel Slide  - 1 x daily - 7 x weekly - 3 sets - 10 reps - Seated Ankle Plantar Flexion with Resistance Loop  - 1 x daily - 7 x weekly - 3 sets - 10 reps - 3 seconds hold   ASTERISK SIGNS     Asterisk Signs Eval (04/07/2022)  9/28 10/5  10/12       R ankle DF neutral     4 degrees       Max pain 10/10  5/10          balance Semi tandem R in rear <10''   >20'' bil   semi tandem on foam >20''      30'' STS 5x w/ UE support     6x w/ UE support                           PATIENT SURVEYS:   FOTO 21 -> 47  05/03/2022: 54%     TODAY'S TREATMENT: [facility]  Adult PT Treatment:                                                DATE: 05/25/2022 Therapeutic Exercise: Nustep level 6 x 6 mins Slant board gastroc stretch 2x1' Standing heel raise 2" step 3x10 Toe raise back against wall 1x10 (too difficult) Seated toe raise 3x10 STS arms crossed 2x10 Neuromuscular re-ed: Semi-tandem on foam (100%) - 45'' bouts Tandem stance - 45'' bouts Wooden rocker board DF/PF - 2'  [facility] Adult PT Treatment:                                                DATE: 05/19/2022 Therapeutic Exercise: Nustep level 5 x 6 mins Slant board gastroc stretch 2x1' Standing heel raise - 2'' step - 3x20 2'' step down - 2x10 ea Seated heel raise - 25# on knee - on step - 3x20 ea  Sit to stand w/ UE support 2x10 Lateral walking with band at counter - 4 laps - Green TB  Neuromuscular Re-ed: Semi-tandem on foam (100%) - 45'' bouts Tandem stance - 45'' bouts Wooden rocker board DF/PF - 2'  [facility] Adult PT Treatment:                                                DATE: 05/12/2022 Therapeutic Exercise: Nustep level 5 x 6 mins Slant board gastroc stretch 2x1' Standing heel raise - 2'' step - 3x15 Seated ankle inversion with 10# cable to forefoot 2x10 BIL Seated ankle eversion with 10# cable to forefoot 2x10 BIL Seated heel raise - 25# on knee - on step - 2x15 ea  Neuromuscular Re-ed: Feet together x45" EC Semi-tandem on foam (75%) - 3'' bouts Wooden rocker board DF/PF - 2'     PATIENT EDUCATION:  POC, diagnosis, prognosis, HEP, and outcome measures.  Pt educated via explanation, demonstration, and handout (HEP).  Pt confirms understanding verbally.     ASSESSMENT:   CLINICAL IMPRESSION: Patient presents to PT with mild pain in her R ankle today  and reports that she feels like she is able to walk easier since beginning PT. Session today continued to focus on BIL LE strengthening and balance tasks. Patient was able to tolerate all prescribed exercises with no adverse  effects. Patient continues to benefit from skilled PT services and should be progressed as able to improve functional independence.    OBJECTIVE IMPAIRMENTS: Pain, R ankle ROM, general LE and hip weakness, balance   ACTIVITY LIMITATIONS: walking, standing, housework, ADLs like shopping   PERSONAL FACTORS: See medical history and pertinent history        GOALS:     SHORT TERM GOALS: Target date: 04/28/2022   Merian will be >75% HEP compliant to improve carryover between sessions and facilitate independent management of condition   Evaluation (04/07/2022): ongoing 05/03/2022: Pt reports adherence to HEP Goal status: ACHIEVED     LONG TERM GOALS: Target date: 06/02/2022   Dak[place]ah will improve FOTO score to 47 as a proxy for functional improvement   Evaluation/Baseline (04/07/2022): 21 05/03/2022: 54% Goal status: ACHIEVED     2.  Kiora will self report >/= 50% decrease in pain from evaluation    Evaluation/Baseline (04/07/2022): 10/10 max pain 05/03/2022: 6/10 max pain 10/10: 65% improvement ~3/10 pain  Goal status: MET     3.  Makaley will be able to stand for >30'' in semi tandem stance, to show a significant improvement in balance in order to reduce fall risk    Evaluation/Baseline (04/07/2022): 5'' R in rear 05/03/2022: 30" BIL Goal status: ACHIEVED     4.  Torunn will improve right DF to >/= 5 degrees   Evaluation/Baseline (04/07/2022): 0 degrees 05/03/2022: 3 degrees 10/12: 4 degrees Goal status: IN PROGRESS         PLAN: PT FREQUENCY: 1-2x/week   PT DURATION: 8 weeks (Ending 06/02/2022)   PLANNED INTERVENTIONS: Therapeutic exercises, Aquatic therapy, Therapeutic activity, Neuro Muscular re-education, Gait training, Patient/Family education, Joint mobilization, Dry Needling, Electrical stimulation, Spinal mobilization and/or manipulation, Moist heat, Taping, Vasopneumatic device, Ionotophoresis 82m/ml Dexamethasone, and Manual therapy   PLAN FOR NEXT  SESSION: progressive loading of achilles, balance    SMargarette CanadaPTA 05/26/22 12:52 PM

## 2022-05-26 ENCOUNTER — Ambulatory Visit: Payer: Medicare PPO

## 2022-05-26 DIAGNOSIS — M25571 Pain in right ankle and joints of right foot: Secondary | ICD-10-CM

## 2022-05-26 DIAGNOSIS — R600 Localized edema: Secondary | ICD-10-CM | POA: Diagnosis not present

## 2022-05-26 DIAGNOSIS — M6281 Muscle weakness (generalized): Secondary | ICD-10-CM | POA: Diagnosis not present

## 2022-05-26 DIAGNOSIS — R2681 Unsteadiness on feet: Secondary | ICD-10-CM

## 2022-06-01 ENCOUNTER — Ambulatory Visit: Payer: Medicare PPO | Admitting: Physical Therapy

## 2022-06-06 DIAGNOSIS — H401132 Primary open-angle glaucoma, bilateral, moderate stage: Secondary | ICD-10-CM | POA: Diagnosis not present

## 2022-06-07 ENCOUNTER — Encounter: Payer: Self-pay | Admitting: Physical Therapy

## 2022-06-07 ENCOUNTER — Ambulatory Visit: Payer: Medicare PPO | Admitting: Physical Therapy

## 2022-06-07 DIAGNOSIS — M6281 Muscle weakness (generalized): Secondary | ICD-10-CM | POA: Diagnosis not present

## 2022-06-07 DIAGNOSIS — R600 Localized edema: Secondary | ICD-10-CM

## 2022-06-07 DIAGNOSIS — M25571 Pain in right ankle and joints of right foot: Secondary | ICD-10-CM | POA: Diagnosis not present

## 2022-06-07 DIAGNOSIS — R2681 Unsteadiness on feet: Secondary | ICD-10-CM

## 2022-06-07 NOTE — Therapy (Signed)
PHYSICAL THERAPY DISCHARGE SUMMARY  Visits from Start of Care: 12  Current functional level related to goals / functional outcomes: See assessment/goals   Remaining deficits: See assessment/goals   Education / Equipment: HEP and D/C plans  Patient agrees to discharge. Patient goals were met. Patient is being discharged due to meeting the stated rehab goals.     Patient Name: Swayze Kozuch MRN: [id number] DOB:[birth year], 66 y.o., female Today's Date: 06/07/2022  PCP: Dorothyann Peng, NP REFERRING PROVIDER: Criselda Peaches, DPM  END OF SESSION:   PT End of Session - 06/07/22 1559     Visit Number 12    Date for PT Re-Evaluation 06/02/22    Authorization Type UHC MCR    Authorization Time Period FOTO v6, v10, kx mod v15    Progress Note Due on Visit 10    PT Start Time 1600    PT Stop Time 1640    PT Time Calculation (min) 40 min    Activity Tolerance Patient tolerated treatment well    Behavior During Therapy [facility] for tasks assessed/performed                Past Medical History:  Diagnosis Date   Allergic rhinitis    Anemia    h/o   Anxiety    Asthma    Pulmonary function Tests 09-2006: mild asthma   Diabetes mellitus    Heart murmur    ECHO 5/10 was neg   Hypertension    Hypothyroidism    Premature surgical menopause on HRT    after hysterectomy 2011   Psychosis ([place]) 05/16/2014   Schizophrenia ([place])    Vaginal discharge 03/2015   Vaginal infection 03/2015   Past Surgical History:  Procedure Laterality Date   ABDOMINAL HYSTERECTOMY  2011   no oophorectomy   TONSILLECTOMY     Patient Active Problem List   Diagnosis Date Noted   Acanthosis nigricans 02/02/2022   Body mass index (BMI) 50.0-59.9, adult ([place]) 02/02/2022   Chronic pain 02/02/2022   Hyperlipidemia 02/02/2022   Moderate persistent asthma with (acute) exacerbation 02/02/2022   Morbid obesity ([place]) 02/02/2022   Papillomavirus as the cause of diseases classified elsewhere  02/02/2022   Skin ulcer ([place]) 02/02/2022   Tinea corporis 02/02/2022   Carpal tunnel syndrome 12/10/2019   Vaginitis 12/22/2017   Candidal vulvovaginitis 12/22/2017   Asthma 12/22/2017   Altered mental status    PCP NOTES >>>>>> 07/23/2015   Cardiac murmur 11/12/2014   Abnormal LFTs 05/22/2014   Decreased potassium in the blood 05/20/2014   Schizophrenia, unspecified ([place]) 05/16/2014   DOE (dyspnea on exertion) 03/01/2013   Back sprain 01/17/2012   Foot pain 12/02/2011   General medical examination 07/04/2011   DJD (degenerative joint disease) 07/04/2011   Hypothyroidism 11/28/2006   DMII (diabetes mellitus, type 2) ([place]) 11/28/2006   ANEMIA-NOS 11/28/2006   HTN (hypertension) 11/28/2006   Allergic rhinitis 11/28/2006   ASTHMA 11/28/2006    REFERRING DIAG: Tendonitis, Achilles, right [M76.61]  THERAPY DIAG:  Pain in right ankle and joints of right foot - Plan: PT plan of care cert/re-cert  Muscle weakness - Plan: PT plan of care cert/re-cert  Unsteadiness on feet - Plan: PT plan of care cert/re-cert  Localized edema - Plan: PT plan of care cert/re-cert  Rationale for Evaluation and Treatment Rehabilitation  PERTINENT HISTORY: Asthma, DM TII, schizophrenia   SUBJECTIVE:  Pt reports that her ankles are doing well, but she is having some L knee pain (3/10)  Pain:  Are you having pain? No Pain location: L knee NPRS scale:  current 3/10  Aggravating factors: walking, steps Relieving factors: rest Pain description: intermittent, sharp, and dull Stage: Chronic Stability: staying the same 24 hour pattern: worse with activity    OBJECTIVE:  HOME EXERCISE PROGRAM: Access Code: [email or web address] URL: [email or web address] Date: 04/07/2022 Prepared by: Shearon Balo   Exercises - Seated Toe Towel Scrunches  - 1 x daily - 7 x weekly - 3 sets - 10 reps - Ankle Inversion Eversion Towel Slide  - 1 x daily - 7 x weekly - 3 sets - 10 reps - Seated Ankle Plantar  Flexion with Resistance Loop  - 1 x daily - 7 x weekly - 3 sets - 10 reps - 3 seconds hold   ASTERISK SIGNS     Asterisk Signs Eval (04/07/2022)  9/28 10/5  10/12  10/31     R ankle DF neutral     4 degrees   5 degrees    Max pain 10/10  5/10          balance Semi tandem R in rear <10''   >20'' bil   semi tandem on foam >20''      30'' STS 5x w/ UE support     6x w/ UE support                           PATIENT SURVEYS:   FOTO 21 -> 47  05/03/2022: 54%     TODAY'S TREATMENT: [facility] Adult PT Treatment:                                                DATE: 06/07/2022 Therapeutic Exercise: Nustep level 6 x 6 mins Slant board gastroc stretch 2x1' Standing heel raise 2" step 3x10 Seated heel raise - 25# on knee - on step - 3x20 ea  Sit to stand w/ UE support 2x10 Lateral walking with band at counter - 4 laps - Green TB Neuromuscular re-ed: Semi-tandem on foam (100%) - 45'' bouts Tandem stance - 45'' bouts Wooden rocker board DF/PF - 2'  [facility] Adult PT Treatment:                                                DATE: 05/19/2022 Therapeutic Exercise: Nustep level 5 x 6 mins Slant board gastroc stretch 2x1' Standing heel raise - 2'' step - 3x20 2'' step down - 2x10 ea Seated heel raise - 25# on knee - on step - 3x20 ea  Sit to stand w/ UE support 2x10 Lateral walking with band at counter - 4 laps - Green TB  Neuromuscular Re-ed: Semi-tandem on foam (100%) - 45'' bouts Tandem stance - 45'' bouts Wooden rocker board DF/PF - 2'  [facility] Adult PT Treatment:                                                DATE: 05/12/2022 Therapeutic Exercise: Nustep level 5 x 6 mins Slant board gastroc stretch 2x1' Standing heel raise - 2'' step -  3x15 Seated ankle inversion with 10# cable to forefoot 2x10 BIL Seated ankle eversion with 10# cable to forefoot 2x10 BIL Seated heel raise - 25# on knee - on step - 2x15 ea  Neuromuscular Re-ed: Feet together x45" EC Semi-tandem on foam (75%) - 45''  bouts Wooden rocker board DF/PF - 2'     PATIENT EDUCATION:  POC, diagnosis, prognosis, HEP, and outcome measures.  Pt educated via explanation, demonstration, and handout (HEP).  Pt confirms understanding verbally.     ASSESSMENT:   CLINICAL IMPRESSION: Keya Wynes has progressed well with therapy.  Improved impairments include: ankle ROM, pain, balance.  Functional improvements include: improved stability in gait and ability to ambulate in community for ADLs.  Progressions needed include: continued work at home with HEP.  Barriers to progress include: sedentary lifestyle.  Please see GOALS section for progress on short term and long term goals established at evaluation.  I recommend D/C home with HEP; pt agrees with plan.     OBJECTIVE IMPAIRMENTS: Pain, R ankle ROM, general LE and hip weakness, balance   ACTIVITY LIMITATIONS: walking, standing, housework, ADLs like shopping   PERSONAL FACTORS: See medical history and pertinent history        GOALS:     SHORT TERM GOALS: Target date: 04/28/2022   Tacha will be >75% HEP compliant to improve carryover between sessions and facilitate independent management of condition   Evaluation (04/07/2022): ongoing 05/03/2022: Pt reports adherence to HEP Goal status: ACHIEVED     LONG TERM GOALS: Target date: 06/02/2022   Ashritha will improve FOTO score to 47 as a proxy for functional improvement   Evaluation/Baseline (04/07/2022): 21 05/03/2022: 54% Goal status: ACHIEVED     2.  Janitza will self report >/= 50% decrease in pain from evaluation    Evaluation/Baseline (04/07/2022): 10/10 max pain 05/03/2022: 6/10 max pain 10/10: 65% improvement ~3/10 pain  Goal status: MET     3.  Kendrick will be able to stand for >30'' in semi tandem stance, to show a significant improvement in balance in order to reduce fall risk    Evaluation/Baseline (04/07/2022): 5'' R in rear 05/03/2022: 30" BIL Goal status: ACHIEVED     4.   Pauline will improve right DF to >/= 5 degrees   Evaluation/Baseline (04/07/2022): 0 degrees 05/03/2022: 3 degrees 10/12: 4 degrees 10/31: 5 degrees Goal status: IN PROGRESS         PLAN: PT FREQUENCY: 1-2x/week   PT DURATION: 8 weeks (Ending 10/31)   PLANNED INTERVENTIONS: Therapeutic exercises, Aquatic therapy, Therapeutic activity, Neuro Muscular re-education, Gait training, Patient/Family education, Joint mobilization, Dry Needling, Electrical stimulation, Spinal mobilization and/or manipulation, Moist heat, Taping, Vasopneumatic device, Ionotophoresis 47m/ml Dexamethasone, and Manual therapy   PLAN FOR NEXT SESSION: progressive loading of achilles, balance    KKevan NyReinhartsen PT 06/07/22 4:46 PM

## 2022-06-15 DIAGNOSIS — F419 Anxiety disorder, unspecified: Secondary | ICD-10-CM | POA: Diagnosis not present

## 2022-07-28 DIAGNOSIS — Z0000 Encounter for general adult medical examination without abnormal findings: Secondary | ICD-10-CM | POA: Diagnosis not present

## 2022-07-28 DIAGNOSIS — E039 Hypothyroidism, unspecified: Secondary | ICD-10-CM | POA: Diagnosis not present

## 2022-07-28 DIAGNOSIS — E1159 Type 2 diabetes mellitus with other circulatory complications: Secondary | ICD-10-CM | POA: Diagnosis not present

## 2022-07-28 DIAGNOSIS — E538 Deficiency of other specified B group vitamins: Secondary | ICD-10-CM | POA: Diagnosis not present

## 2022-07-28 DIAGNOSIS — J4541 Moderate persistent asthma with (acute) exacerbation: Secondary | ICD-10-CM | POA: Diagnosis not present

## 2022-07-28 DIAGNOSIS — D649 Anemia, unspecified: Secondary | ICD-10-CM | POA: Diagnosis not present

## 2022-07-28 DIAGNOSIS — E559 Vitamin D deficiency, unspecified: Secondary | ICD-10-CM | POA: Diagnosis not present

## 2022-07-28 DIAGNOSIS — I10 Essential (primary) hypertension: Secondary | ICD-10-CM | POA: Diagnosis not present

## 2022-07-28 DIAGNOSIS — E785 Hyperlipidemia, unspecified: Secondary | ICD-10-CM | POA: Diagnosis not present

## 2022-07-28 DIAGNOSIS — M1990 Unspecified osteoarthritis, unspecified site: Secondary | ICD-10-CM | POA: Diagnosis not present

## 2022-07-29 ENCOUNTER — Other Ambulatory Visit: Payer: Self-pay | Admitting: Family Medicine

## 2022-07-29 DIAGNOSIS — E2839 Other primary ovarian failure: Secondary | ICD-10-CM

## 2022-08-04 ENCOUNTER — Ambulatory Visit
Admission: RE | Admit: 2022-08-04 | Discharge: 2022-08-04 | Disposition: A | Discharge status: Home / Self Care | Payer: Medicare PPO | Source: Ambulatory Visit | Attending: Family Medicine | Admitting: Family Medicine

## 2022-08-04 DIAGNOSIS — Z780 Asymptomatic menopausal state: Secondary | ICD-10-CM | POA: Diagnosis not present

## 2022-08-04 DIAGNOSIS — E2839 Other primary ovarian failure: Secondary | ICD-10-CM

## 2022-08-29 DIAGNOSIS — E039 Hypothyroidism, unspecified: Secondary | ICD-10-CM | POA: Diagnosis not present

## 2022-09-07 DIAGNOSIS — F419 Anxiety disorder, unspecified: Secondary | ICD-10-CM | POA: Diagnosis not present

## 2022-09-15 DIAGNOSIS — Z6841 Body Mass Index (BMI) 40.0 and over, adult: Secondary | ICD-10-CM | POA: Diagnosis not present

## 2022-09-15 DIAGNOSIS — N760 Acute vaginitis: Secondary | ICD-10-CM | POA: Diagnosis not present

## 2022-09-23 ENCOUNTER — Other Ambulatory Visit: Payer: Self-pay

## 2022-09-23 ENCOUNTER — Emergency Department (HOSPITAL_COMMUNITY)
Admission: EM | Admit: 2022-09-23 | Discharge: 2022-09-23 | Disposition: A | Discharge status: Home / Self Care | Payer: Medicare PPO | Attending: Emergency Medicine | Admitting: Emergency Medicine

## 2022-09-23 DIAGNOSIS — E119 Type 2 diabetes mellitus without complications: Secondary | ICD-10-CM | POA: Diagnosis not present

## 2022-09-23 DIAGNOSIS — H60501 Unspecified acute noninfective otitis externa, right ear: Secondary | ICD-10-CM | POA: Diagnosis not present

## 2022-09-23 DIAGNOSIS — H9201 Otalgia, right ear: Secondary | ICD-10-CM | POA: Diagnosis present

## 2022-09-23 MED ORDER — CIPROFLOXACIN-DEXAMETHASONE 0.3-0.1 % OT SUSP
4.0000 [drp] | Freq: Once | OTIC | Status: AC
  Administered 2022-09-23: 4 [drp] via OTIC
  Filled 2022-09-23: qty 7.5

## 2022-09-23 NOTE — ED Provider Notes (Signed)
[facility] Emergency Department Provider Note MRN:  [id number]  Arrival date & time: 09/23/22     Chief Complaint   Ear Injury (Hair pin stuck in her right ear) and Ear Injury   History of Present Illness   Taylor Ramirez is a 67 y.o. year-old female presents to the ED with chief complaint of right ear pain.  States that she has been having itching in her ear for the past few days.  States that she was scratching the inside of her ear with a hairpin in her sleep and thinks that the hairpin is in her ear canal.  Denies any other complaints.  History provided by patient.   Review of Systems  Pertinent positive and negative review of systems noted in HPI.    Physical Exam   Vitals:   09/23/22 0438  BP: 131/65  Pulse: (!) 111  Resp: 18  Temp: 98 F (36.7 C)  SpO2: 100%    CONSTITUTIONAL:  well-appearing, NAD NEURO:  Alert and oriented x 3, CN 3-12 grossly intact EYES:  eyes equal and reactive ENT/NECK:  Supple, no stridor, swelling and edema to R ear canal with organic debris consistent with otitis externa, no visible FB or TM perforation CARDIO:  tachycardic, appears well-perfused  PULM:  No respiratory distress,  GI/GU:  non-distended,  MSK/SPINE:  No gross deformities, no edema, moves all extremities  SKIN:  no rash, atraumatic   *Additional and/or pertinent findings included in MDM below  Diagnostic and Interventional Summary    EKG Interpretation  Date/Time:    Ventricular Rate:    PR Interval:    QRS Duration:   QT Interval:    QTC Calculation:   R Axis:     Text Interpretation:         Labs Reviewed - No data to display  No orders to display    Medications  ciprofloxacin-dexamethasone (CIPRODEX) 0.3-0.1 % OTIC (EAR) suspension 4 drop (4 drops Right EAR Given 09/23/22 [id number])     Procedures  /  Critical Care Procedures  ED Course and Medical Decision Making  I have reviewed the triage vital signs, the nursing  notes, and pertinent available records from the EMR.  Social Determinants Affecting Complexity of Care: Patient has no clinically significant social determinants affecting this chief complaint..   ED Course:    Medical Decision Making Patient here with right ear pain.  Her exam is consistent with otitis externa.  I don't see a FB.  I've given her ciprodex drops for the otitis externa.  Patient is diabetic.  No mastoid tenderness.  Doubt malignant otitis externa.  Risk Prescription drug management.     Consultants: No consultations were needed in caring for this patient.   Treatment and Plan: Emergency department workup does not suggest an emergent condition requiring admission or immediate intervention beyond  what has been performed at this time. The patient is safe for discharge and has  been instructed to return immediately for worsening symptoms, change in  symptoms or any other concerns    Final Clinical Impressions(s) / ED Diagnoses     ICD-10-CM   1. Acute otitis externa of right ear, unspecified type  H60.501       ED Discharge Orders     None         Discharge Instructions Discussed with and Provided to Patient:    Discharge Instructions      Please instill 4 drops in the right ear morning and  evening for the next 7 days.      Montine Circle, PA-C 09/23/22 [place]arr [place], MD 09/23/22 [phone]

## 2022-09-23 NOTE — ED Triage Notes (Signed)
Patient stated that a hairpin is stuck inside her right ear this evening.

## 2022-09-23 NOTE — Discharge Instructions (Addendum)
Please instill 4 drops in the right ear morning and evening for the next 7 days.

## 2022-10-07 DIAGNOSIS — L299 Pruritus, unspecified: Secondary | ICD-10-CM | POA: Diagnosis not present

## 2022-10-13 DIAGNOSIS — L400 Psoriasis vulgaris: Secondary | ICD-10-CM | POA: Diagnosis not present

## 2022-11-08 ENCOUNTER — Other Ambulatory Visit: Payer: Self-pay | Admitting: Family Medicine

## 2022-11-08 DIAGNOSIS — Z1231 Encounter for screening mammogram for malignant neoplasm of breast: Secondary | ICD-10-CM

## 2022-11-15 DIAGNOSIS — H524 Presbyopia: Secondary | ICD-10-CM | POA: Diagnosis not present

## 2022-11-15 DIAGNOSIS — H25813 Combined forms of age-related cataract, bilateral: Secondary | ICD-10-CM | POA: Diagnosis not present

## 2022-11-15 DIAGNOSIS — H35463 Secondary vitreoretinal degeneration, bilateral: Secondary | ICD-10-CM | POA: Diagnosis not present

## 2022-11-15 DIAGNOSIS — H401132 Primary open-angle glaucoma, bilateral, moderate stage: Secondary | ICD-10-CM | POA: Diagnosis not present

## 2022-11-15 DIAGNOSIS — E119 Type 2 diabetes mellitus without complications: Secondary | ICD-10-CM | POA: Diagnosis not present

## 2022-11-23 DIAGNOSIS — E119 Type 2 diabetes mellitus without complications: Secondary | ICD-10-CM | POA: Diagnosis not present

## 2022-11-23 DIAGNOSIS — M25561 Pain in right knee: Secondary | ICD-10-CM | POA: Diagnosis not present

## 2022-11-25 DIAGNOSIS — M1711 Unilateral primary osteoarthritis, right knee: Secondary | ICD-10-CM | POA: Diagnosis not present

## 2022-12-21 ENCOUNTER — Ambulatory Visit
Admission: RE | Admit: 2022-12-21 | Discharge: 2022-12-21 | Disposition: A | Discharge status: Home / Self Care | Payer: Medicare Other | Source: Ambulatory Visit | Attending: Family Medicine | Admitting: Family Medicine

## 2022-12-21 DIAGNOSIS — Z1231 Encounter for screening mammogram for malignant neoplasm of breast: Secondary | ICD-10-CM | POA: Diagnosis not present

## 2022-12-26 DIAGNOSIS — M1711 Unilateral primary osteoarthritis, right knee: Secondary | ICD-10-CM | POA: Diagnosis not present

## 2023-02-02 DIAGNOSIS — I10 Essential (primary) hypertension: Secondary | ICD-10-CM | POA: Diagnosis not present

## 2023-02-02 DIAGNOSIS — E559 Vitamin D deficiency, unspecified: Secondary | ICD-10-CM | POA: Diagnosis not present

## 2023-02-02 DIAGNOSIS — E039 Hypothyroidism, unspecified: Secondary | ICD-10-CM | POA: Diagnosis not present

## 2023-02-02 DIAGNOSIS — E1136 Type 2 diabetes mellitus with diabetic cataract: Secondary | ICD-10-CM | POA: Diagnosis not present

## 2023-03-06 ENCOUNTER — Encounter (INDEPENDENT_AMBULATORY_CARE_PROVIDER_SITE_OTHER): Payer: Medicare Other | Admitting: Physician Assistant

## 2023-04-04 DIAGNOSIS — J309 Allergic rhinitis, unspecified: Secondary | ICD-10-CM | POA: Diagnosis not present

## 2023-04-04 DIAGNOSIS — I10 Essential (primary) hypertension: Secondary | ICD-10-CM | POA: Diagnosis not present

## 2023-04-04 DIAGNOSIS — R600 Localized edema: Secondary | ICD-10-CM | POA: Diagnosis not present

## 2023-04-26 DIAGNOSIS — Z23 Encounter for immunization: Secondary | ICD-10-CM | POA: Diagnosis not present

## 2023-04-26 DIAGNOSIS — I10 Essential (primary) hypertension: Secondary | ICD-10-CM | POA: Diagnosis not present

## 2023-05-12 DIAGNOSIS — R051 Acute cough: Secondary | ICD-10-CM | POA: Diagnosis not present

## 2023-05-15 DIAGNOSIS — R0602 Shortness of breath: Secondary | ICD-10-CM | POA: Diagnosis not present

## 2023-05-15 DIAGNOSIS — J40 Bronchitis, not specified as acute or chronic: Secondary | ICD-10-CM | POA: Diagnosis not present

## 2023-07-31 DIAGNOSIS — Z0000 Encounter for general adult medical examination without abnormal findings: Secondary | ICD-10-CM | POA: Diagnosis not present

## 2023-07-31 DIAGNOSIS — E039 Hypothyroidism, unspecified: Secondary | ICD-10-CM | POA: Diagnosis not present

## 2023-07-31 DIAGNOSIS — D649 Anemia, unspecified: Secondary | ICD-10-CM | POA: Diagnosis not present

## 2023-07-31 DIAGNOSIS — I10 Essential (primary) hypertension: Secondary | ICD-10-CM | POA: Diagnosis not present

## 2023-07-31 DIAGNOSIS — L853 Xerosis cutis: Secondary | ICD-10-CM | POA: Diagnosis not present

## 2023-07-31 DIAGNOSIS — E785 Hyperlipidemia, unspecified: Secondary | ICD-10-CM | POA: Diagnosis not present

## 2023-07-31 DIAGNOSIS — E559 Vitamin D deficiency, unspecified: Secondary | ICD-10-CM | POA: Diagnosis not present

## 2023-07-31 DIAGNOSIS — M1990 Unspecified osteoarthritis, unspecified site: Secondary | ICD-10-CM | POA: Diagnosis not present

## 2023-07-31 DIAGNOSIS — E1159 Type 2 diabetes mellitus with other circulatory complications: Secondary | ICD-10-CM | POA: Diagnosis not present

## 2023-07-31 DIAGNOSIS — Z23 Encounter for immunization: Secondary | ICD-10-CM | POA: Diagnosis not present

## 2023-10-29 IMAGING — MG MM DIGITAL SCREENING BILAT W/ TOMO AND CAD
8 of 14 series · 8 of 40 positions shown · non-contrast
Comparison: Previous exam(s).

CLINICAL DATA: Screening.

EXAM:
DIGITAL SCREENING BILATERAL MAMMOGRAM WITH TOMOSYNTHESIS AND CAD
TECHNIQUE: Bilateral screening digital craniocaudal and mediolateral oblique
mammograms were obtained. Bilateral screening digital breast
tomosynthesis was performed. The images were evaluated with
computer-aided detection.

[L MLO synth-2D]
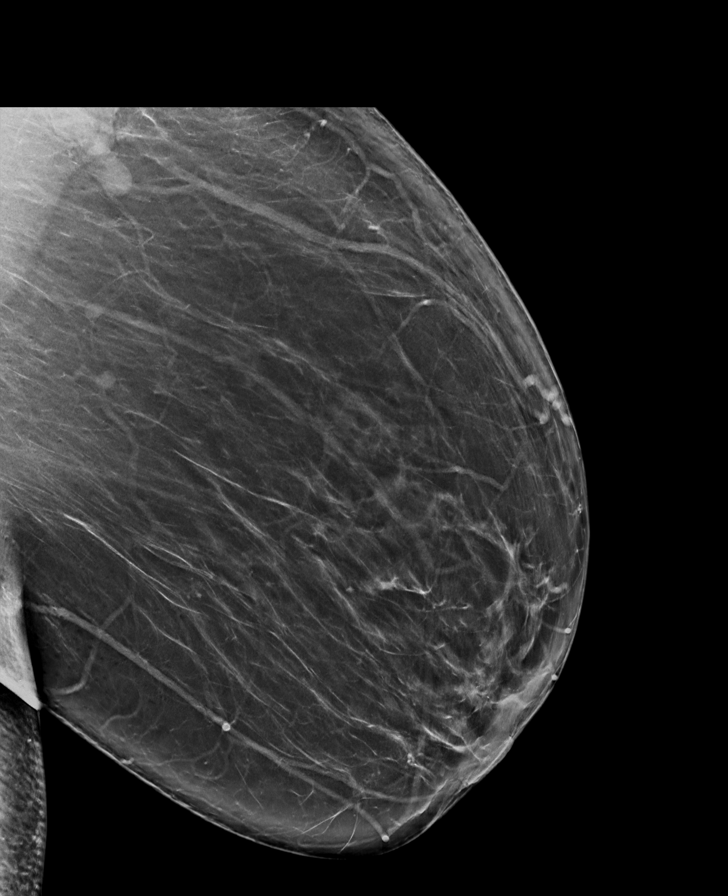

[L CC synth-2D (1 of 2)]
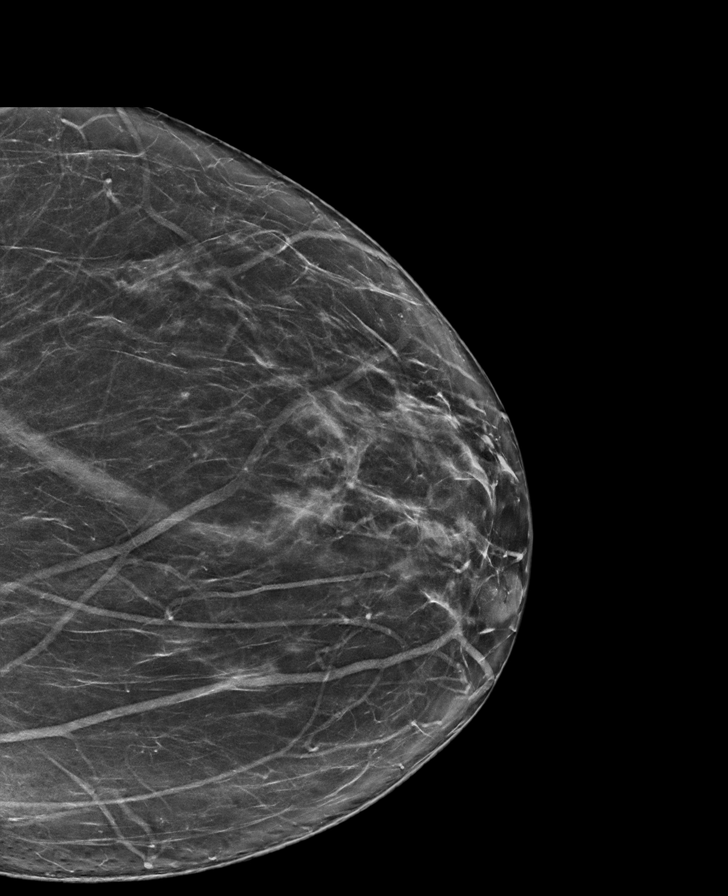

[R CV synth-2D]
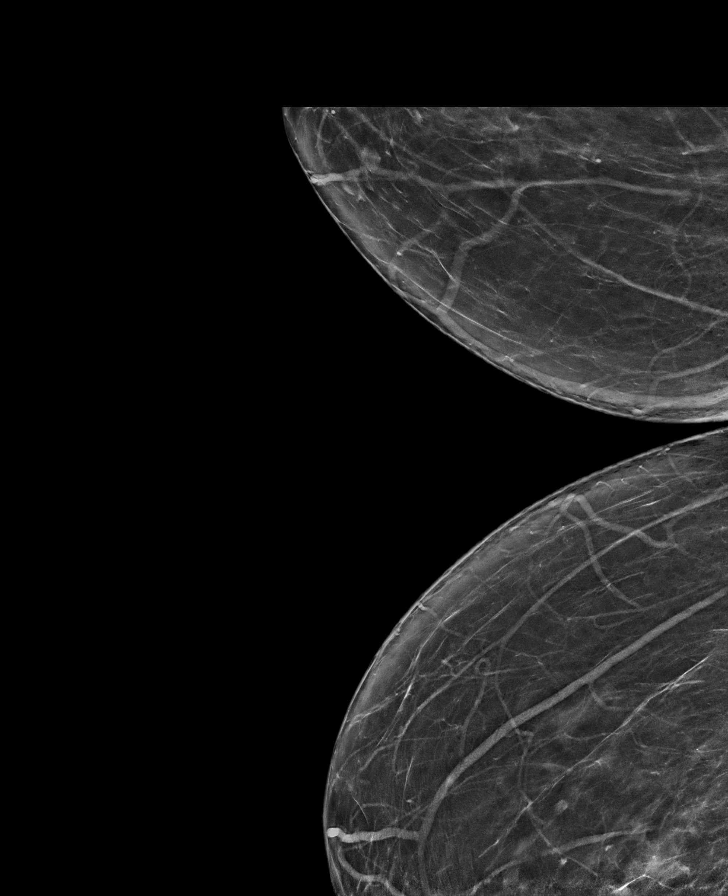

[L CC synth-2D (2 of 2)]
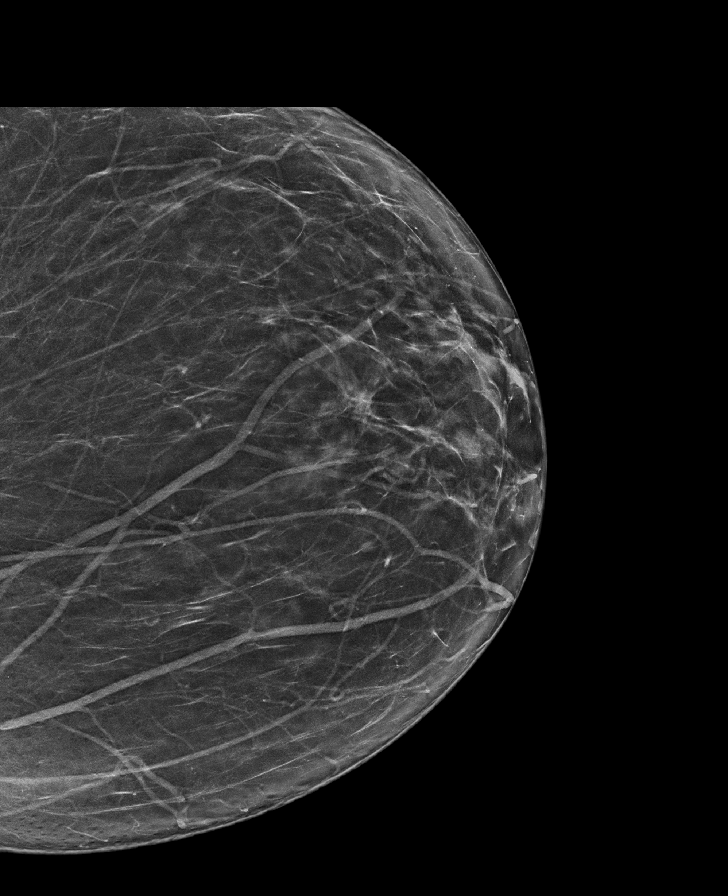

[R MLO synth-2D (1 of 2)]
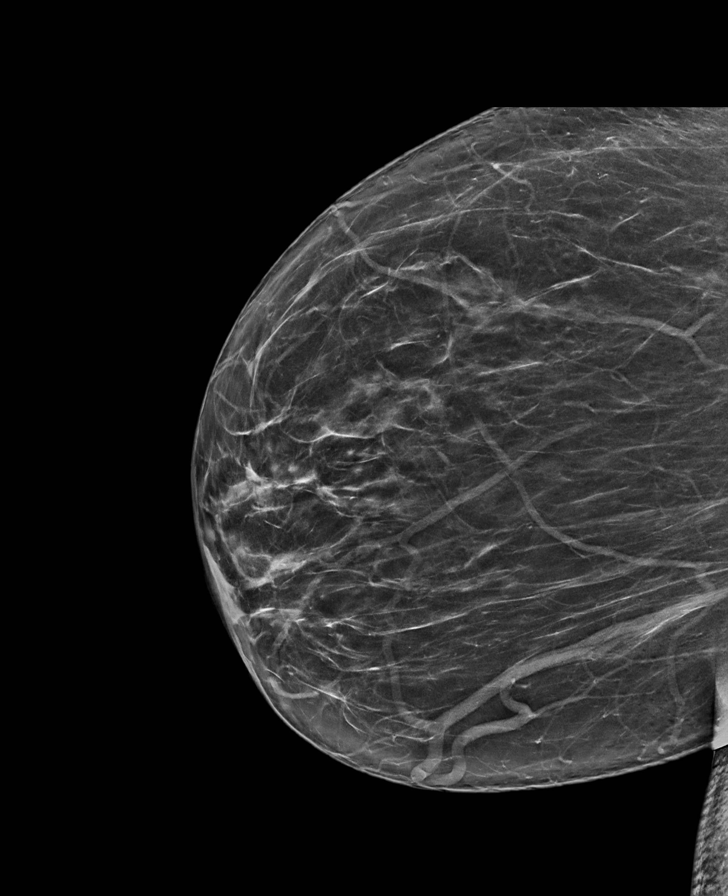

[R MLO synth-2D (2 of 2)]
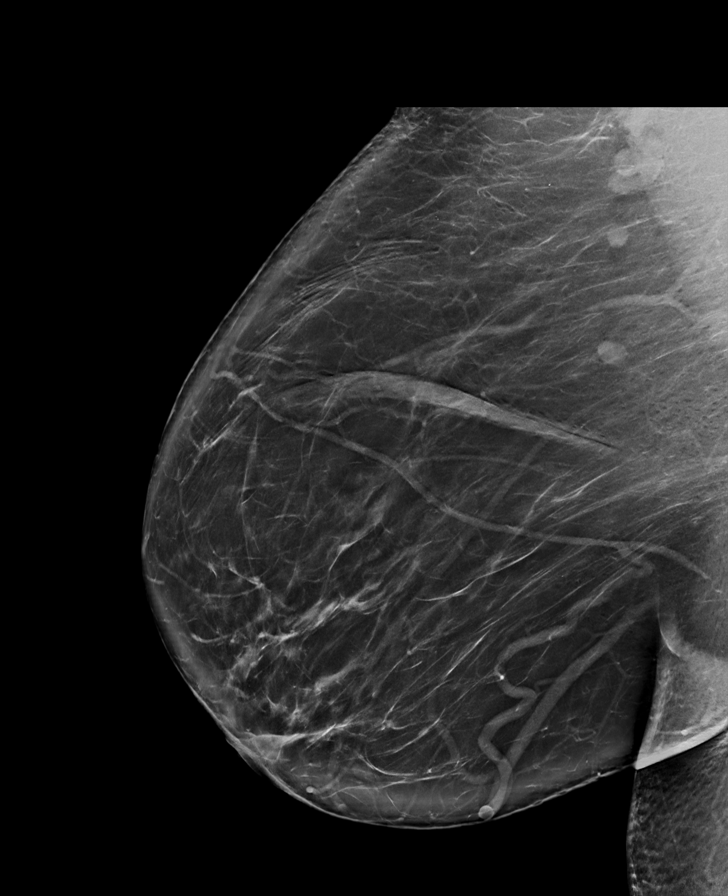

[R CC synth-2D]
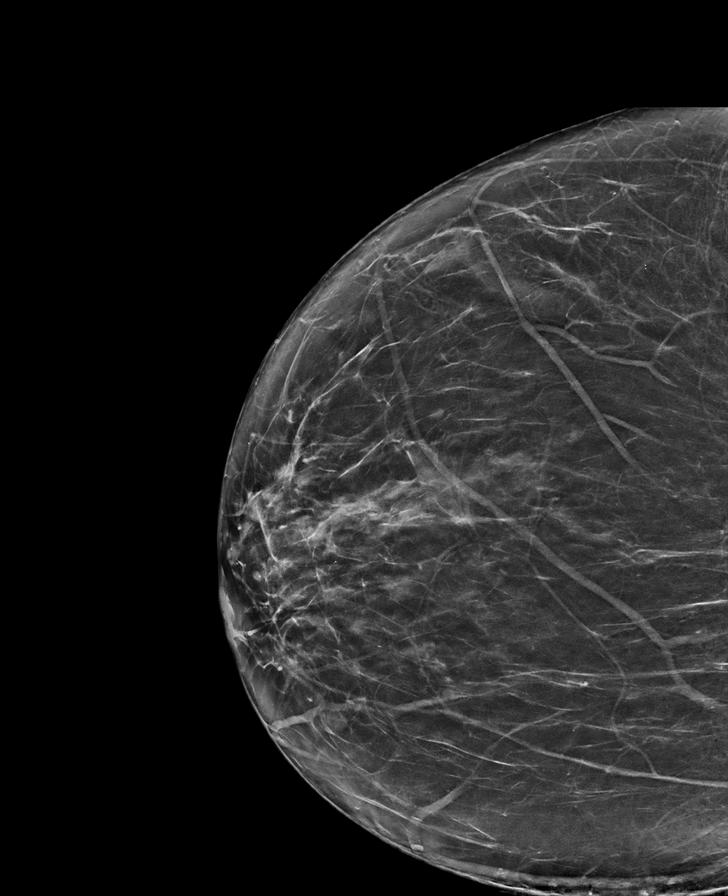

[L MLO tomo · tomo slice 49/96.0]
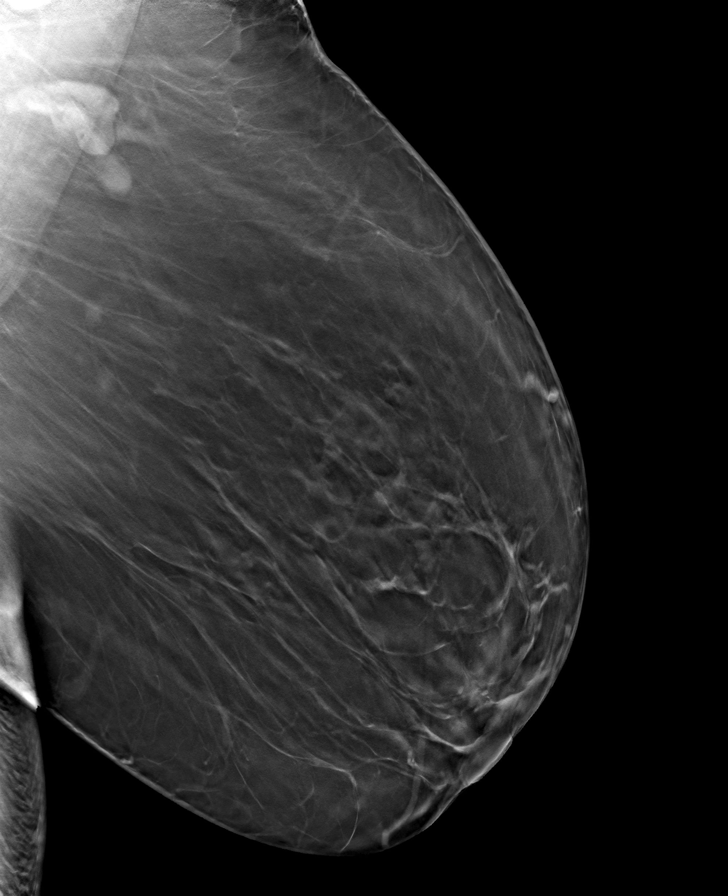

[8 of 40 positions shown; findings below may reference images not displayed]

ACR Breast Density Category b: There are scattered areas of
fibroglandular density.
FINDINGS: There are no findings suspicious for malignancy.
IMPRESSION: No mammographic evidence of malignancy. A result letter of this
screening mammogram will be mailed directly to the patient.

RECOMMENDATION:
Screening mammogram in one year. (Code:51-O-LD2)

BI-RADS CATEGORY  1: Negative.

## 2023-12-05 ENCOUNTER — Other Ambulatory Visit: Payer: Self-pay | Admitting: Family Medicine

## 2023-12-05 DIAGNOSIS — Z1231 Encounter for screening mammogram for malignant neoplasm of breast: Secondary | ICD-10-CM

## 2023-12-25 ENCOUNTER — Ambulatory Visit
Admission: RE | Admit: 2023-12-25 | Discharge: 2023-12-25 | Disposition: A | Discharge status: Home / Self Care | Source: Ambulatory Visit | Attending: Family Medicine | Admitting: Family Medicine

## 2023-12-25 DIAGNOSIS — Z1231 Encounter for screening mammogram for malignant neoplasm of breast: Secondary | ICD-10-CM
